# Patient Record
Sex: Male | Born: 1960 | Race: White | Hispanic: No | Marital: Married | Smoking: Former smoker
Health system: Southern US, Community
[De-identification: ages and names within clinical notes are randomized; demographics above are authoritative.]

## PROBLEM LIST (undated history)

## (undated) DIAGNOSIS — H269 Unspecified cataract: Secondary | ICD-10-CM

## (undated) DIAGNOSIS — F32A Depression, unspecified: Secondary | ICD-10-CM

## (undated) DIAGNOSIS — R112 Nausea with vomiting, unspecified: Secondary | ICD-10-CM

## (undated) DIAGNOSIS — G4730 Sleep apnea, unspecified: Secondary | ICD-10-CM

## (undated) DIAGNOSIS — Z9889 Other specified postprocedural states: Secondary | ICD-10-CM

## (undated) DIAGNOSIS — J45909 Unspecified asthma, uncomplicated: Secondary | ICD-10-CM

## (undated) DIAGNOSIS — E785 Hyperlipidemia, unspecified: Secondary | ICD-10-CM

## (undated) HISTORY — DX: Unspecified cataract: H26.9

## (undated) HISTORY — DX: Sleep apnea, unspecified: G47.30

## (undated) HISTORY — DX: Hyperlipidemia, unspecified: E78.5

## (undated) HISTORY — DX: Depression, unspecified: F32.A

## (undated) HISTORY — PX: SINUS SURGERY WITH INSTATRAK: SHX5215

## (undated) HISTORY — DX: Unspecified asthma, uncomplicated: J45.909

---

## 2009-02-07 ENCOUNTER — Ambulatory Visit (HOSPITAL_COMMUNITY): Admission: RE | Admit: 2009-02-07 | Discharge: 2009-02-07 | Payer: Self-pay | Admitting: Orthopedic Surgery

## 2011-12-10 ENCOUNTER — Encounter (INDEPENDENT_AMBULATORY_CARE_PROVIDER_SITE_OTHER): Payer: Self-pay | Admitting: *Deleted

## 2011-12-20 ENCOUNTER — Encounter (INDEPENDENT_AMBULATORY_CARE_PROVIDER_SITE_OTHER): Payer: Self-pay | Admitting: *Deleted

## 2011-12-20 ENCOUNTER — Telehealth (INDEPENDENT_AMBULATORY_CARE_PROVIDER_SITE_OTHER): Payer: Self-pay | Admitting: *Deleted

## 2011-12-20 ENCOUNTER — Other Ambulatory Visit (INDEPENDENT_AMBULATORY_CARE_PROVIDER_SITE_OTHER): Payer: Self-pay | Admitting: *Deleted

## 2011-12-20 DIAGNOSIS — Z1211 Encounter for screening for malignant neoplasm of colon: Secondary | ICD-10-CM

## 2011-12-20 NOTE — Telephone Encounter (Signed)
agree

## 2011-12-20 NOTE — Telephone Encounter (Signed)
PCP/Requesting MD: Helene Kelp   Name & DOB: Randon Harbeck [birth year]   Procedure: tcs  Reason/Indication:  screening  Has patient had this procedure before?  no  If so, when, by whom and where?    Is there a family history of colon cancer?  no  Who?  What age when diagnosed?  no  Is patient diabetic?   no      Does patient have prosthetic heart valve?  no  Do you have a pacemaker?  no  Has patient had joint replacement within last 12 months?  no  Is patient on Coumadin, Plavix and/or Aspirin? no  Medications: tylenol prn  Allergies: asa  Medication Adjustment:   Procedure date & time: 01/09/12 at 11:15

## 2011-12-20 NOTE — Telephone Encounter (Signed)
Patient needs movi prep 

## 2011-12-24 MED ORDER — PEG-KCL-NACL-NASULF-NA ASC-C 100 G PO SOLR: 1.0000 | Freq: Once | ORAL | Status: DC

## 2012-01-02 ENCOUNTER — Encounter (HOSPITAL_COMMUNITY): Payer: Self-pay | Admitting: Pharmacy Technician

## 2012-01-09 ENCOUNTER — Ambulatory Visit (HOSPITAL_COMMUNITY)
Admission: RE | Admit: 2012-01-09 | Discharge: 2012-01-09 | Disposition: A | Discharge status: Home / Self Care | Payer: BC Managed Care – PPO | Source: Ambulatory Visit | Attending: Internal Medicine | Admitting: Internal Medicine

## 2012-01-09 ENCOUNTER — Encounter (HOSPITAL_COMMUNITY)
Admission: RE | Disposition: A | Discharge status: Home / Self Care | Payer: Self-pay | Source: Ambulatory Visit | Attending: Internal Medicine

## 2012-01-09 ENCOUNTER — Encounter (HOSPITAL_COMMUNITY): Payer: Self-pay | Admitting: *Deleted

## 2012-01-09 DIAGNOSIS — K644 Residual hemorrhoidal skin tags: Secondary | ICD-10-CM

## 2012-01-09 DIAGNOSIS — Z1211 Encounter for screening for malignant neoplasm of colon: Secondary | ICD-10-CM

## 2012-01-09 DIAGNOSIS — K6389 Other specified diseases of intestine: Secondary | ICD-10-CM

## 2012-01-09 DIAGNOSIS — K5730 Diverticulosis of large intestine without perforation or abscess without bleeding: Secondary | ICD-10-CM | POA: Insufficient documentation

## 2012-01-09 HISTORY — DX: Nausea with vomiting, unspecified: R11.2

## 2012-01-09 HISTORY — PX: COLONOSCOPY: SHX5424

## 2012-01-09 HISTORY — DX: Other specified postprocedural states: Z98.890

## 2012-01-09 SURGERY — COLONOSCOPY
Anesthesia: Moderate Sedation

## 2012-01-09 MED ORDER — MEPERIDINE HCL 50 MG/ML IJ SOLN
INTRAMUSCULAR | Status: AC
  Filled 2012-01-09: qty 1

## 2012-01-09 MED ORDER — MIDAZOLAM HCL 5 MG/5ML IJ SOLN
INTRAMUSCULAR | Status: AC
  Filled 2012-01-09: qty 10

## 2012-01-09 MED ORDER — STERILE WATER FOR IRRIGATION IR SOLN
Status: DC | PRN
  Administered 2012-01-09: 11:00:00

## 2012-01-09 MED ORDER — MIDAZOLAM HCL 5 MG/5ML IJ SOLN
INTRAMUSCULAR | Status: DC | PRN
  Administered 2012-01-09: 2 mg via INTRAVENOUS
  Administered 2012-01-09: 1 mg via INTRAVENOUS
  Administered 2012-01-09: 2 mg via INTRAVENOUS

## 2012-01-09 MED ORDER — SODIUM CHLORIDE 0.45 % IV SOLN
INTRAVENOUS | Status: DC
  Administered 2012-01-09: 1000 mL via INTRAVENOUS

## 2012-01-09 MED ORDER — MEPERIDINE HCL 50 MG/ML IJ SOLN
INTRAMUSCULAR | Status: DC | PRN
  Administered 2012-01-09 (×2): 25 mg via INTRAVENOUS

## 2012-01-09 NOTE — Op Note (Signed)
COLONOSCOPY PROCEDURE REPORT  PATIENT:  Guy Sanchez  MR#:  [id number] Birthdate:  05-23-1960, 51 y.o., male Endoscopist:  Dr. Malissa Hippo, MD Referred By:    Doy Hutching. Caryn Bee, [place] Procedure Date: 01/09/2012  Procedure:   Colonoscopy  Indications:  Patient is 51 year old Caucasian male was undergoing average risk screening colonoscopy.  Informed Consent:  The procedure and risks were reviewed with the patient and informed consent was obtained.  Medications:  Demerol 50 mg IV Versed 5 mg IV  Description of procedure:  After a digital rectal exam was performed, that colonoscope was advanced from the anus through the rectum and colon to the area of the cecum, ileocecal valve and appendiceal orifice. The cecum was deeply intubated. These structures were well-seen and photographed for the record. From the level of the cecum and ileocecal valve, the scope was slowly and cautiously withdrawn. The mucosal surfaces were carefully surveyed utilizing scope tip to flexion to facilitate fold flattening as needed. The scope was pulled down into the rectum where a thorough exam including retroflexion was performed.  Findings:   Prep excellent. Patchy mucosal pigmentation involving sigmoid and descending colon. Single diverticulum at sigmoid colon. Normal rectal mucosa. Small hemorrhoids below the dentate line.  Therapeutic/Diagnostic Maneuvers Performed:  noe  Complications:  none  Cecal Withdrawal Time:  9 minutes  Impression:  Examination performed to cecum. 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 Single elliptical and at sigmoid colon. Small external hemorrhoids. No evidence of colonic polyps.  Recommendations:  Standard instructions given. Next screening exam in 10 years.  Guy Sanchez U  01/09/2012 11:43 AM  CC: Dr. Horald Pollen., PA & Dr. Bonnetta Barry ref. provider found

## 2012-01-09 NOTE — H&P (Signed)
Guy Sanchez is an 51 y.o. male.   Chief Complaint: Patient is here for colonoscopy. HPI: Patient is 51 year old Caucasian male who is undergoing  screening colonoscopy. This is patient's first exam. He denies abdominal pain rectal bleeding or change in his bowel habits. Family history is negative for colorectal carcinoma.  Past Medical History  Diagnosis Date  . PONV (postoperative nausea and vomiting)     Past Surgical History  Procedure Date  . Sinus surgery with instatrak     History reviewed. No pertinent family history. Social History:  reports that he has never smoked. He does not have any smokeless tobacco history on file. He reports that he drinks alcohol. He reports that he does not use illicit drugs.  Allergies:  Allergies  Allergen Reactions  . Aspirin Other (See Comments)    Stomach problems from childhood.    Medications Prior to Admission  Medication Sig Dispense Refill  . Multiple Vitamins-Minerals (MULTIVITAMIN PO) Take 1 tablet by mouth daily.      . niacin 100 MG tablet Take 100 mg by mouth daily with breakfast.      . peg 3350 powder (MOVIPREP) 100 G SOLR Take 1 kit (100 g total) by mouth once.  1 kit  0  . Glucosamine 500 MG TABS Take 1 tablet by mouth daily.        No results found for this or any previous visit (from the past 48 hour(s)). No results found.  ROS  Blood pressure 126/74, pulse 51, temperature 97.7 F (36.5 C), temperature source Oral, resp. rate 15, height 6\' 3"  (1.905 m), weight 184 lb (83.462 kg), SpO2 96.00%. Physical Exam  Constitutional: He appears well-developed and well-nourished.  HENT:  Mouth/Throat: Oropharynx is clear and moist.  Eyes: Conjunctivae normal are normal. No scleral icterus.  Neck: No thyromegaly present.  Cardiovascular: Normal rate, regular rhythm and normal heart sounds.   No murmur heard. Respiratory: Effort normal and breath sounds normal.  GI: Soft. He exhibits no distension and no mass. There is no  tenderness.  Musculoskeletal: He exhibits no edema.  Lymphadenopathy:    He has no cervical adenopathy.  Neurological: He is alert.  Skin: Skin is warm and dry.     Assessment/Plan Average risk screening colonoscopy.  Sundus Pete U 01/09/2012, 11:11 AM

## 2012-01-14 ENCOUNTER — Encounter (HOSPITAL_COMMUNITY): Payer: Self-pay | Admitting: Internal Medicine

## 2014-01-15 ENCOUNTER — Telehealth: Payer: Self-pay | Admitting: Physician Assistant

## 2014-01-15 ENCOUNTER — Encounter (INDEPENDENT_AMBULATORY_CARE_PROVIDER_SITE_OTHER): Payer: Self-pay

## 2014-01-15 ENCOUNTER — Ambulatory Visit (INDEPENDENT_AMBULATORY_CARE_PROVIDER_SITE_OTHER): Payer: Federal, State, Local not specified - PPO | Admitting: Family Medicine

## 2014-01-15 ENCOUNTER — Encounter: Payer: Self-pay | Admitting: Family Medicine

## 2014-01-15 VITALS — BP 127/70 | HR 72 | Temp 98.0°F | Ht 75.0 in | Wt 187.4 lb

## 2014-01-15 DIAGNOSIS — J069 Acute upper respiratory infection, unspecified: Secondary | ICD-10-CM

## 2014-01-15 MED ORDER — AZITHROMYCIN 250 MG PO TABS: ORAL_TABLET | ORAL | Status: DC

## 2014-01-15 NOTE — Progress Notes (Signed)
   Subjective:    Patient ID: Guy Sanchez, male    DOB: 30-Sep-1960, 53 y.o.   MRN: [id number]  HPI Patient is here with c/o sinus congestion and uri sx's.  Review of Systems    No chest pain, SOB, HA, dizziness, vision change, N/V, diarrhea, constipation, dysuria, urinary urgency or frequency, myalgias, arthralgias or rash.  Objective:    BP 127/70 mmHg  Pulse 72  Temp(Src) 98 F (36.7 C) (Oral)  Ht 6\' 3"  (1.905 m)  Wt 187 lb 6.4 oz (85.004 kg)  BMI 23.42 kg/m2 Physical Exam Vital signs noted  Well developed well nourished male.  HEENT - Head atraumatic Normocephalic                Eyes - PERRLA, Conjuctiva - clear Sclera- Clear EOMI                Ears - EAC's Wnl TM's Wnl Gross Hearing WNL                Nose - Nares patent                 Throat - oropharanx wnl Respiratory - Lungs CTA bilateral Cardiac - RRR S1 and S2 without murmur GI - Abdomen soft Nontender and bowel sounds active x 4 Extremities - No edema. Neuro - Grossly intact.       Assessment & Plan:     ICD-9-CM ICD-10-CM   1. URI (upper respiratory infection) 465.9 J06.9 azithromycin (ZITHROMAX) 250 MG tablet   Push po fluids, rest, tylenol and motrin otc prn as directed for fever, arthralgias, and myalgias.  Follow up prn if sx's continue or persist.  No Follow-up on file.  Deatra CanterWilliam J Oxford FNP

## 2014-01-15 NOTE — Telephone Encounter (Signed)
Appt given for today 

## 2016-01-30 DIAGNOSIS — J342 Deviated nasal septum: Secondary | ICD-10-CM | POA: Insufficient documentation

## 2016-01-30 DIAGNOSIS — J329 Chronic sinusitis, unspecified: Secondary | ICD-10-CM | POA: Insufficient documentation

## 2016-01-30 DIAGNOSIS — J310 Chronic rhinitis: Secondary | ICD-10-CM | POA: Insufficient documentation

## 2016-01-30 DIAGNOSIS — J339 Nasal polyp, unspecified: Secondary | ICD-10-CM

## 2016-02-10 ENCOUNTER — Encounter: Payer: Self-pay | Admitting: Family Medicine

## 2016-02-10 ENCOUNTER — Ambulatory Visit (INDEPENDENT_AMBULATORY_CARE_PROVIDER_SITE_OTHER): Payer: BC Managed Care – PPO | Admitting: Family Medicine

## 2016-02-10 VITALS — BP 116/69 | Ht 75.5 in | Wt 191.0 lb

## 2016-02-10 DIAGNOSIS — M7501 Adhesive capsulitis of right shoulder: Secondary | ICD-10-CM | POA: Diagnosis not present

## 2016-02-10 DIAGNOSIS — M67922 Unspecified disorder of synovium and tendon, left upper arm: Secondary | ICD-10-CM | POA: Diagnosis not present

## 2016-02-10 NOTE — Patient Instructions (Signed)
Shoulder routine--4-5 days a weel Dumbbells--choose a qweight you can do 8-12 repetitions. Goal is three sets per day of each exercise. Shoulder overhead press, lateral and forward raise, chest press, bicep and triceps.  Great to see you!

## 2016-02-11 DIAGNOSIS — M779 Enthesopathy, unspecified: Secondary | ICD-10-CM | POA: Insufficient documentation

## 2016-02-11 DIAGNOSIS — M7501 Adhesive capsulitis of right shoulder: Secondary | ICD-10-CM | POA: Insufficient documentation

## 2016-02-11 DIAGNOSIS — M67922 Unspecified disorder of synovium and tendon, left upper arm: Secondary | ICD-10-CM | POA: Insufficient documentation

## 2016-02-11 NOTE — Assessment & Plan Note (Signed)
Right shoulder pain that is most likely related to some primary mild adhesive capsulitis particularly in the posterior capsule.  I showed him stretching motion for the posterior capsule and encouraged him to do that 10 or more times daily.  Also gave him encouragement to return to his previous workout protocol we have discussed exercises and weights in detail.  He notes he was asymptomatic when he was going to gym much more readily.  Even if he can just do the basic upper body shoulder routine for 5 days a week I think he would improve.  I would expect this to improve over the next 4-6 weeks, if he has any new or worsening symptoms or does not resolve he will follow-up when necessary.

## 2016-02-11 NOTE — Progress Notes (Signed)
Guy SaranMichael Sanchez - [age] y.o. male MRN [id number]  Date of birth: 1960-03-30    SUBJECTIVE:      Chief Complaint: HPI:  1. Left elbow pain: Has been gradually worsening for the last 8-12 months.  No specific injury.  He does note that it seems to started around the time he bought a new base guitar.  Says initially he was so excited to have the base guitar that he was playing and sometimes 2+ hours a day.  He notes that the neck of the guitar is greater in circumference, the cords are harder to.  Recently he's only been playing it intermittently.  The pain in his elbow is with certain movements and if he tries to carry anything in his left hand.  Pain 2-3 out of 10 at its worst.  Can be sharp and shooting.  Occasionally aching.  He's had no weakness or numbness in his left hand.  He is right-hand dominant.  #2.  Right shoulder pain: This is also been going on for better part of a year.  Pain is with certain movements and he feels his range of motion is limited because of this.  Most notable when he reaches backward or upward.  He's noted no weakness in the arm.   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  He's noted no atrophy of the right arm, no weakness, no numbness or tingling, no problem with fine motor skills.  In his job he also has to frequently carry heavy equipment, usually carries it on the right side.  Sounds like it is usually in a briefcase or similar container.    ROS:     See HPI above.  Additional pertinent review of systems is negative for other unusual arthralgias or myalgias, no unusual weight change.  Denies fever, sweats, chills.  PERTINENT  PMH / PSH FH / / SH:  Past Medical, Surgical, Social, and Family History Reviewedfrom the notes kindly provided by his PCP:   Pertinent findings include:  Prior history of some right rotator cuff issues for which she received a steroid injection many years ago.  No  personal history of diabetes mellitus. No prior hospitalizations or major  Surgeries on joints, has had sinus surgery..  Former smoker.  No first-degree relative with history of unusual rheumatological problems.  OBJECTIVE: BP 116/69   Ht 6' 3.5" (1.918 m)   Wt 191 lb (86.6 kg)   BMI 23.56 kg/m   Physical Exam:  Vital signs are reviewed. GENERAL: Tall, well-developed male no acute distress SHOULDERS: Left shoulder full range of motion and normal strength.  Right shoulder has intact strength in all planes of the rotator cuff, intact strength bicep and tricep.  Some mild pain and limited range of motion in backward extension  This is where his issues seem to occur most.  He also has some mild pain with direct overhead press. SCAPULAR: Symmetrical scapular motion without dyskinesia.  Normal upper back musculature. BACK: Vertebral column appears to be symmetrical, intact, there is no pain with percussion.  There is no tenderness to palpation of the back musculature. ELBOW: Bilaterally he has full range of motion in flexion and extension.   On the right he has intact range of motion strength with supination/pronation.  On the left he has mild pain with supination and pronation  And the pain appears to be in the flexor and extensor muscle mass and it  is very mildly tender to palpation when the muscle is in active distress. RISKS: Bilaterally for range of motion flexion extension, painless activity. NEURO: Nerve roots C4-5-6 are intact and bilaterally symmetrical.  ASSESSMENT & PLAN: For assessment  and plan please see problem oriented charting.

## 2016-02-11 NOTE — Assessment & Plan Note (Signed)
Subacute or chronic left elbow pain.  He does seem to have originated when he began playing the base guitar.  I suspect the unusual position in the increased difficulty with beats tension on the cords has placed him in some chronic overuse.   I gave him handout on forearm exercises for extensor and flexor tendons and we discussed at length.

## 2016-11-24 ENCOUNTER — Ambulatory Visit (HOSPITAL_COMMUNITY)
Admission: EM | Admit: 2016-11-24 | Discharge: 2016-11-24 | Disposition: A | Discharge status: Home / Self Care | Payer: BC Managed Care – PPO | Attending: Family Medicine | Admitting: Family Medicine

## 2016-11-24 ENCOUNTER — Encounter (HOSPITAL_COMMUNITY): Payer: Self-pay | Admitting: Family Medicine

## 2016-11-24 DIAGNOSIS — T7840XA Allergy, unspecified, initial encounter: Secondary | ICD-10-CM

## 2016-11-24 DIAGNOSIS — Z91018 Allergy to other foods: Secondary | ICD-10-CM

## 2016-11-24 DIAGNOSIS — L509 Urticaria, unspecified: Secondary | ICD-10-CM

## 2016-11-24 DIAGNOSIS — L500 Allergic urticaria: Secondary | ICD-10-CM

## 2016-11-24 MED ORDER — DIPHENHYDRAMINE HCL 50 MG/ML IJ SOLN
50.0000 mg | Freq: Once | INTRAMUSCULAR | Status: AC
  Administered 2016-11-24: 50 mg via INTRAVENOUS

## 2016-11-24 MED ORDER — METHYLPREDNISOLONE SODIUM SUCC 125 MG IJ SOLR
125.0000 mg | Freq: Once | INTRAMUSCULAR | Status: AC
  Administered 2016-11-24: 125 mg via INTRAMUSCULAR

## 2016-11-24 MED ORDER — PREDNISONE 10 MG (21) PO TBPK: ORAL_TABLET | Freq: Every day | ORAL | 0 refills | Status: DC

## 2016-11-24 MED ORDER — METHYLPREDNISOLONE SODIUM SUCC 125 MG IJ SOLR
INTRAMUSCULAR | Status: AC
  Filled 2016-11-24: qty 2

## 2016-11-24 MED ORDER — DIPHENHYDRAMINE HCL 50 MG/ML IJ SOLN
INTRAMUSCULAR | Status: AC
  Filled 2016-11-24: qty 1

## 2016-11-24 NOTE — ED Triage Notes (Signed)
Pt here for allergic reaction to possible tuna. Hives all over body. Slight tightening in throat.

## 2016-11-26 NOTE — ED Provider Notes (Signed)
  [facility]   [id number] 11/24/16 Arrival Time: 1954  ASSESSMENT & PLAN:  1. Allergic reaction, initial encounter   2. Hives     Meds ordered this encounter  Medications  . methylPREDNISolone sodium succinate (SOLU-MEDROL) 125 mg/2 mL injection 125 mg  . diphenhydrAMINE (BENADRYL) injection 50 mg  . predniSONE (STERAPRED UNI-PAK 21 TAB) 10 MG (21) TBPK tablet    Sig: Take by mouth daily. Take as directed.    Dispense:  21 tablet    Refill:  0   Emergency Department if any worsening. He and his wife agree. Reviewed expectations re: course of current medical issues. Questions answered. Outlined signs and symptoms indicating need for more acute intervention. Patient verbalized understanding. After Visit Summary given.   SUBJECTIVE:  Guy Sanchez is a 56 y.o. male who presents with complaint of possible allergic reaction. Hives all over approx 20 minutes after eating tuna. Afebrile. No SOB. "A little tight in my throat." No n/v. No specific aggravating or alleviating factors reported. Ambulatory without difficulty. No OTC treatment. No h/o similar. No abdominal or chest pain.  ROS: As per HPI. All other systems negative.   OBJECTIVE:  Vitals:   11/24/16 2004  BP: (!) 105/59  Pulse: 85  Resp: 18  SpO2: 97%    General appearance: alert; no distress; speaking in full sentences Eyes: PERRLA; EOMI; conjunctiva normal HENT: normocephalic; atraumatic; oral exam normal Lungs: clear to auscultation bilaterally Heart: regular rate and rhythm Abdomen: soft, non-tender Skin: diffuse hives all over body Psychological: alert and cooperative; normal mood and affect  Allergies  Allergen Reactions  . Aspirin Other (See Comments)    Stomach problems from childhood.    Past Medical History:  Diagnosis Date  . PONV (postoperative nausea and vomiting)    Social History   Social History  . Marital status: Married    Spouse name: N/A  . Number of children: N/A  .  Years of education: N/A   Occupational History  . Not on file.   Social History Main Topics  . Smoking status: Never Smoker  . Smokeless tobacco: Not on file  . Alcohol use 0.0 oz/week     Comment: social drinker  . Drug use: No  . Sexual activity: Not on file   Other Topics Concern  . Not on file   Social History Narrative  . No narrative on file   No FH of seafood allergy.  Past Surgical History:  Procedure Laterality Date  . COLONOSCOPY  01/09/2012   Procedure: COLONOSCOPY;  Surgeon: Malissa Hippo, MD;  Location: [facility];  Service: Endoscopy;  Laterality: N/A;  11:15  . SINUS SURGERY WITH Danie Binder, MD 11/26/16 1005

## 2018-08-11 DIAGNOSIS — J343 Hypertrophy of nasal turbinates: Secondary | ICD-10-CM | POA: Insufficient documentation

## 2019-04-14 ENCOUNTER — Ambulatory Visit: Payer: Self-pay

## 2019-04-14 ENCOUNTER — Ambulatory Visit: Payer: BC Managed Care – PPO | Admitting: Family Medicine

## 2019-04-14 ENCOUNTER — Other Ambulatory Visit: Payer: Self-pay

## 2019-04-14 ENCOUNTER — Encounter: Payer: Self-pay | Admitting: Family Medicine

## 2019-04-14 VITALS — BP 104/70 | HR 89 | Ht 75.5 in | Wt 200.4 lb

## 2019-04-14 DIAGNOSIS — M25521 Pain in right elbow: Secondary | ICD-10-CM

## 2019-04-14 DIAGNOSIS — M25512 Pain in left shoulder: Secondary | ICD-10-CM

## 2019-04-14 NOTE — Progress Notes (Signed)
Subjective:    I'm seeing this patient as a consultation for:  Dr. Renne Crigler. Note will be routed back to referring provider/PCP.  CC: L shoulder and R elbow and forearm pain  I, Molly Weber, LAT, ATC, am serving as scribe for Dr. Clementeen Graham.  HPI: Pt is a 59 y/o male presenting w/ c/o L shoulder and R arm pain.  L shoulder:  Pain began x 5 months ago w/ no known MOI.  He rates his pain at a 4-6/10 w/ aggravating activities and describes his pain as aching.  Radiating pain: No L shoulder mechanical symptoms: No L UE weakness: No Aggravating factors: Donning/doffing shirt; getting arm into position for a back squat Treatments tried: HEP that he found online including pec stretching and shoulder extension  R arm: R arm pain began Fall 2020 due mainly to repetitive computer use.  He rates his pain at a 4-5/10 and describes his pain as aching.  Radiating pain: Yes into the R posterior forearm R UE weakness: Yes w/ R grip strength Aggravating factors: Computer use; yard work Treatments tried: wrist brace; rest  Past medical history, Surgical history, Family history, Social history, Allergies, and medications have been entered into the medical record, reviewed.   Review of Systems: No new headache, visual changes, nausea, vomiting, diarrhea, constipation, dizziness, abdominal pain, skin rash, fevers, chills, night sweats, weight loss, swollen lymph nodes, body aches, joint swelling, muscle aches, chest pain, shortness of breath, mood changes, visual or auditory hallucinations.   Objective:    Vitals:   04/14/19 0907  BP: 104/70  Pulse: 89  SpO2: 96%   General: Well Developed, well nourished, and in no acute distress.  Neuro/Psych: Alert and oriented x3, extra-ocular muscles intact, able to move all 4 extremities, sensation grossly intact. Skin: Warm and dry, no rashes noted.  Respiratory: Not using accessory muscles, speaking in full sentences, trachea midline.  Cardiovascular:  Pulses palpable, no extremity edema. Abdomen: Does not appear distended. MSK:  C-spine: Normal motion normal-appearing nontender.  Left shoulder: Normal-appearing Nontender Full abduction, and internal range of motion.  External rotation limited about 30 degrees beyond neutral position. Intact strength to abduction external and internal rotation. Mildly positive Hawkins and Neer's test. Negative Yergason's and speeds test.  Contralateral right shoulder: Normal.  Appearing Nontender Normal motion. Normal strength. Negative impingement and biceps tendinitis testing.  Right elbow: Normal-appearing. Normal motion. Tender palpation lateral epicondyle. Pain with resisted wrist and finger extension.  Left elbow normal-appearing normal motion nontender normal strength.  Lab and Radiology Results  Limited musculoskeletal ultrasound left shoulder: Biceps tendon normal-appearing intact in bicipital groove. Subscapularis tendon with calcific change and insertion and irregular footplate.  No visible tear. Supraspinatus tendon normal-appearing with no significant subacromial bursitis. Infraspinatus tendon slightly thinned no definitive tears otherwise normal-appearing AC joint mildly narrowed mild effusion. 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  Right lateral epicondyle elbow: Relatively normal-appearing with no significant tear disruption.  No elbow effusion. Impression: Lateral epicondylitis  Impression and Recommendations:    Assessment and Plan: 59 y.o. male with left shoulder pain and left elbow pain.  Left shoulder pain: Evidence of rotator cuff tendinopathy on physical exam and on ultrasound examination today.  Discussed options.  Plan for trial of home exercise program and physical therapy.  Recommend also Voltaren gel.  Recheck back in about a month.  If not better would proceed with injection.  Right elbow pain: Lateral epicondylitis:  Again plan for home exercise program and  some physical therapy.  Voltaren gel should be helpful as well.  Check back in 1 month..  CC: Deland Pretty, MD  PDMP not reviewed this encounter. Orders Placed This Encounter  Procedures  . [place] LIMITED JOINT SPACE STRUCTURES UP BILAT(NO LINKED CHARGES)    Order Specific Question:   Reason for Exam (SYMPTOM  OR DIAGNOSIS REQUIRED)    Answer:   R elbow pain    Order Specific Question:   Preferred imaging location?    Answer:   [place]  . [place] LIMITED JOINT SPACE STRUCTURES UP LEFT(NO LINKED CHARGES)    Order Specific Question:   Reason for Exam (SYMPTOM  OR DIAGNOSIS REQUIRED)    Answer:   left shoulder pain    Order Specific Question:   Preferred imaging location?    Answer:   [place]    Order Specific Question:   Release to patient    Answer:   Immediate  . Ambulatory referral to Physical Therapy    Referral Priority:   Routine    Referral Type:   Physical Medicine    Referral Reason:   Specialty Services Required    Requested Specialty:   Physical Therapy   No orders of the defined types were placed in this encounter.   Discussed warning signs or symptoms. Please see discharge instructions. Patient expresses understanding.   The above documentation has been reviewed and is accurate and complete Lynne Leader

## 2019-04-14 NOTE — Patient Instructions (Addendum)
Thank you for coming in today. Attend PT.  Use voltaren gel up to 4x daily for pain.  Work on the exercises we reviewed.  Recheck in about 1 month.  Return sooner if needed.  Please perform the exercise program that we have prepared for you and gone over in detail on a daily basis.  In addition to the handout you were provided you can access your program through: [email or web address]   Your unique program code is: [email or web address] and [id number]

## 2019-04-21 ENCOUNTER — Encounter: Payer: Self-pay | Admitting: Family Medicine

## 2019-04-21 ENCOUNTER — Ambulatory Visit: Payer: BC Managed Care – PPO | Attending: Family Medicine

## 2019-05-07 ENCOUNTER — Other Ambulatory Visit: Payer: Self-pay

## 2019-05-07 ENCOUNTER — Ambulatory Visit: Payer: BC Managed Care – PPO | Attending: Family Medicine

## 2019-05-07 DIAGNOSIS — M25612 Stiffness of left shoulder, not elsewhere classified: Secondary | ICD-10-CM | POA: Insufficient documentation

## 2019-05-07 DIAGNOSIS — M25521 Pain in right elbow: Secondary | ICD-10-CM | POA: Insufficient documentation

## 2019-05-07 DIAGNOSIS — M25512 Pain in left shoulder: Secondary | ICD-10-CM | POA: Insufficient documentation

## 2019-05-07 DIAGNOSIS — G8929 Other chronic pain: Secondary | ICD-10-CM | POA: Diagnosis present

## 2019-05-07 NOTE — Patient Instructions (Signed)
Tape management to remove if irritated to skin , leave on for 3-4 days  If helpful.  LT shoulder ER at wall , wall slide in corner. Behind back with strap, 2-3 reps 2-3x/day 10-30 sec

## 2019-05-07 NOTE — Therapy (Signed)
[place], [place], [address] Phone: [phone]   Fax:  [phone]  Physical Therapy Evaluation  Patient Details  Name: Guy Sanchez MRN: [id number] Date of Birth: 03-07-1960 Referring Provider (PT): Lynne Leader, Md   Encounter Date: 05/07/2019  PT End of Session - 05/07/19 0739    Visit Number  1    Number of Visits  12    Date for PT Re-Evaluation  06/19/19    Authorization Type  BCBS    PT Start Time  [phone]    PT Stop Time  0735    PT Time Calculation (min)  42 min    Activity Tolerance  Patient tolerated treatment well    Behavior During Therapy  [facility] for tasks assessed/performed       Past Medical History:  Diagnosis Date  . PONV (postoperative nausea and vomiting)     Past Surgical History:  Procedure Laterality Date  . COLONOSCOPY  01/09/2012   Procedure: COLONOSCOPY;  Surgeon: Rogene Houston, MD;  [facility];  Service: Endoscopy;  Laterality: N/A;  11:15  . SINUS SURGERY WITH INSTATRAK      There were no vitals filed for this visit.   Subjective Assessment - 05/07/19 0657    Subjective  Started in fall 2020 working on computer. Was doing some tree trimming also. ROM decr LT shoulder. Was squatting with weight  last spring for exercises.  RT elbow and forearm. grip strength decreased.  Exercises have helped. Wrist was painful also.   Was wearing a brace for 3 weeks.    Limitations  Lifting   gripping.     Computer down so full pain assessment not done   [facility] PT Assessment - 05/07/19 0001      Assessment   Medical Diagnosis  right elbow , LT shoulder pain    Referring Provider (PT)  Lynne Leader, Md    Onset Date/Surgical Date  --   fall 2020   Prior Therapy  No      Precautions   Precautions  None      Restrictions   Weight Bearing Restrictions  No      Balance Screen   Has the patient fallen in the past 6 months  No      Prior Function   Level of  Independence  Independent      Cognition   Overall Cognitive Status  Within Functional Limits for tasks assessed      Posture/Postural Control   Posture/Postural Control  No significant limitations      ROM / Strength   AROM / PROM / Strength  AROM;Strength      AROM   Overall AROM Comments  elbow ROM equal to LT     AROM Assessment Site  Shoulder;Elbow    Right/Left Shoulder  Left;Right    Right Shoulder Flexion  145 Degrees    Right Shoulder ABduction  130 Degrees    Right Shoulder Internal Rotation  55 Degrees    Right Shoulder External Rotation  125 Degrees    Left Shoulder Flexion  125 Degrees    Left Shoulder ABduction  130 Degrees    Left Shoulder Internal Rotation  38 Degrees    Left Shoulder External Rotation  80 Degrees    Left Shoulder Horizontal ABduction  5 Degrees    Left Shoulder Horizontal ADduction  120 Degrees    Right/Left Elbow  Right      Strength  Overall Strength Comments  WNL UE ,    Grip 113 RT and LT       Palpation   Palpation comment  tender along exensor mechanism of RT forarm                Objective measurements completed on examination: See above findings.              PT Education - 05/07/19 0731    Education Details  POC HEP    Person(s) Educated  Patient    Methods  Explanation;Demonstration;Tactile cues;Verbal cues;Handout    Comprehension  Verbalized understanding;Returned demonstration       PT Short Term Goals - 05/07/19 0743      PT SHORT TERM GOAL #1   Title  He will be indpendent with initial hEP    Time  2    Period  Weeks    Status  New      PT SHORT TERM GOAL #2   Title  LT shoulder ER increased to 95 degrees active    Time  2    Period  Weeks    Status  New      PT SHORT TERM GOAL #3   Title  activ eLT shoulder flexion incr to  90 degrees    Time  2    Period  Weeks    Status  New      PT SHORT TERM GOAL #4   Title  RT forarm pain decreased 20% or mroe    Time  2    Period  Weeks     Status  New        PT Long Term Goals - 05/07/19 0745      PT LONG TERM GOAL #1   Title  He will be indpenendt all HEP issued    Time  6    Period  Weeks    Status  New      PT LONG TERM GOAL #2   Title  He will report free painfree ROM of Lt shoulder    Time  6    Period  Weeks    Status  New      PT LONG TERM GOAL #3   Title  He will report pain decreased 75% or more with computer use and lifting    Time  6    Period  Weeks    Status  New      PT LONG TERM GOAL #4   Title  elbow FOTO decrease ot 25% limited    Time  6    Period  Weeks    Status  New             Plan - 05/07/19 0739    Clinical Impression Statement  Guy Sanchez  presents with RT elbow/forearm pain and LT shoulder pain and stiffness .  He is independetn but has some issues with computer uses and lifting and gripping with RT ahand. and reaching with  Lt arm.      He should improve with skilled PT and consistent HEP    Personal Factors and Comorbidities  Time since onset of injury/illness/exacerbation    Examination-Activity Limitations  Reach Overhead;Lift    Stability/Clinical Decision Making  Stable/Uncomplicated    Clinical Decision Making  Low    Rehab Potential  Good    PT Frequency  2x / week    PT Duration  6 weeks    PT Treatment/Interventions  Taping;Dry  needling;Passive range of motion;Manual techniques;Therapeutic exercise;Therapeutic activities;Patient/family education;Iontophoresis 4mg /ml Dexamethasone;Moist Heat;Ultrasound    PT Next Visit Plan  STW to RT forearm , Mobds anmd ROM to LT shoulder, modalites as needed . assess tape.    PT Home Exercise Plan  strap behind back stretch , corner overhead stretch  doorway ER stretch , wrist flexion steretch    Consulted and Agree with Plan of Care  Patient       Patient will benefit from skilled therapeutic intervention in order to improve the following deficits and impairments:  Decreased range of motion, Pain, Decreased activity tolerance,  Increased muscle spasms, Impaired UE functional use  Visit Diagnosis: Pain in right elbow  Chronic left shoulder pain  Stiffness of left shoulder, not elsewhere classified     Problem List Patient Active Problem List   Diagnosis Date Noted  . Adhesive capsulitis of right shoulder 02/11/2016  . Tendinopathy of left elbow 02/11/2016  . Chronic rhinitis 01/30/2016  . Deviated septum 01/30/2016  . Sinusitis with nasal polyps 01/30/2016    02/01/2016  PT 05/07/2019, 7:48 AM  [facility] [address] [place], [place], [place] Phone: [phone]   Fax:  [phone]  Name: Guy Sanchez MRN: [name] Date of Birth: [birth year]

## 2019-05-12 ENCOUNTER — Ambulatory Visit: Payer: BC Managed Care – PPO

## 2019-05-12 ENCOUNTER — Other Ambulatory Visit: Payer: Self-pay

## 2019-05-12 DIAGNOSIS — M25521 Pain in right elbow: Secondary | ICD-10-CM | POA: Diagnosis not present

## 2019-05-12 DIAGNOSIS — M25612 Stiffness of left shoulder, not elsewhere classified: Secondary | ICD-10-CM

## 2019-05-12 DIAGNOSIS — M25512 Pain in left shoulder: Secondary | ICD-10-CM

## 2019-05-12 DIAGNOSIS — G8929 Other chronic pain: Secondary | ICD-10-CM

## 2019-05-12 NOTE — Patient Instructions (Signed)
Added 3 sets of wrist extension RT  Followed by stretch 30 sec at home 2-3 pouinds 1-2x/day

## 2019-05-12 NOTE — Therapy (Signed)
[place], [place], [address] Phone: [phone]   Fax:  [phone]  Physical Therapy Treatment  Patient Details  Name: Guy Sanchez MRN: [id number] Date of Birth: 1960-08-08 Referring Provider (PT): Lynne Leader, MD   Encounter Date: 05/12/2019  PT End of Session - 05/12/19 0709    Visit Number  2    Number of Visits  12    Date for PT Re-Evaluation  06/19/19    Authorization Type  BCBS    PT Start Time  0701    PT Stop Time  0745    PT Time Calculation (min)  44 min    Activity Tolerance  Patient tolerated treatment well    Behavior During Therapy  [facility] for tasks assessed/performed       Past Medical History:  Diagnosis Date  . PONV (postoperative nausea and vomiting)     Past Surgical History:  Procedure Laterality Date  . COLONOSCOPY  01/09/2012   Procedure: COLONOSCOPY;  Surgeon: Rogene Houston, MD;  [facility];  Service: Endoscopy;  Laterality: N/A;  11:15  . SINUS SURGERY WITH INSTATRAK      There were no vitals filed for this visit.  Subjective Assessment - 05/12/19 0714    Subjective  No new issues since last session. Tape of no benefit.                       [facility] Adult PT Treatment/Exercise - 05/12/19 0001      Exercises   Exercises  Wrist;Shoulder      Wrist Exercises   Wrist Extension  Right;10 reps    Bar Weights/Barbell (Wrist Extension)  2 lbs    Wrist Extension Limitations  3 sets followed by 30 sec wrist extensor stretch      Manual Therapy   Manual Therapy  Joint mobilization;Passive ROM    Joint Mobilization  LT shoulder inferior and posterior glides    Passive ROM  all directions 30 sec  3 reps to tolerance      UBE 6 min L2       PT Education - 05/12/19 0858    Education Details  HEP    Person(s) Educated  Patient    Methods  Explanation;Verbal cues   declined handout   Comprehension  Returned demonstration;Verbalized  understanding       PT Short Term Goals - 05/07/19 0743      PT SHORT TERM GOAL #1   Title  He will be indpendent with initial hEP    Time  2    Period  Weeks    Status  New      PT SHORT TERM GOAL #2   Title  LT shoulder ER increased to 95 degrees active    Time  2    Period  Weeks    Status  New      PT SHORT TERM GOAL #3   Title  activ eLT shoulder flexion incr to  90 degrees    Time  2    Period  Weeks    Status  New      PT SHORT TERM GOAL #4   Title  RT forarm pain decreased 20% or mroe    Time  2    Period  Weeks    Status  New        PT Long Term Goals - 05/07/19 0745      PT LONG  TERM GOAL #1   Title  He will be indpenendt all HEP issued    Time  6    Period  Weeks    Status  New      PT LONG TERM GOAL #2   Title  He will report free painfree ROM of Lt shoulder    Time  6    Period  Weeks    Status  New      PT LONG TERM GOAL #3   Title  He will report pain decreased 75% or more with computer use and lifting    Time  6    Period  Weeks    Status  New      PT LONG TERM GOAL #4   Title  elbow FOTO decrease ot 25% limited    Time  6    Period  Weeks    Status  New            Plan - 05/12/19 0709    Clinical Impression Statement  No incr pain with stretching nad exercises.  mild soreness in RT forearm to start but no pain.    LT shoulde increased pain end range but no lasting pain post.    PT Treatment/Interventions  Taping;Dry needling;Passive range of motion;Manual techniques;Therapeutic exercise;Therapeutic activities;Patient/family education;Iontophoresis 4mg /ml Dexamethasone;Moist Heat;Ultrasound    PT Next Visit Plan  STW to RT forearm , Mobds anmd ROM to LT shoulder, modalites as needed .    PT Home Exercise Plan  strap behind back stretch , corner overhead stretch  doorway ER stretch , wrist flexion steretch    Consulted and Agree with Plan of Care  Patient       Patient will benefit from skilled therapeutic intervention in order  to improve the following deficits and impairments:  Decreased range of motion, Pain, Decreased activity tolerance, Increased muscle spasms, Impaired UE functional use  Visit Diagnosis: Pain in right elbow  Chronic left shoulder pain  Stiffness of left shoulder, not elsewhere classified     Problem List Patient Active Problem List   Diagnosis Date Noted  . Adhesive capsulitis of right shoulder 02/11/2016  . Tendinopathy of left elbow 02/11/2016  . Chronic rhinitis 01/30/2016  . Deviated septum 01/30/2016  . Sinusitis with nasal polyps 01/30/2016    02/01/2016  PT 05/12/2019, 9:01 AM  [facility] [address] [place], [place], [place] Phone: [phone]   Fax:  [phone]  Name: Guy Sanchez MRN: [name] Date of Birth: 03-22-1960

## 2019-05-13 ENCOUNTER — Ambulatory Visit: Payer: BC Managed Care – PPO | Admitting: Family Medicine

## 2019-05-13 ENCOUNTER — Encounter: Payer: Self-pay | Admitting: Family Medicine

## 2019-05-13 VITALS — BP 120/68 | HR 90 | Ht 75.5 in | Wt 199.2 lb

## 2019-05-13 DIAGNOSIS — M67912 Unspecified disorder of synovium and tendon, left shoulder: Secondary | ICD-10-CM

## 2019-05-13 DIAGNOSIS — M7711 Lateral epicondylitis, right elbow: Secondary | ICD-10-CM

## 2019-05-13 NOTE — Patient Instructions (Addendum)
Thank you for coming in today. Continue home exercises and PT.  Recheck with me as needed.  Communicate if not improving enough and we can discuss next steps.  Injection vs further imaging ie MRI.

## 2019-05-13 NOTE — Progress Notes (Signed)
   I, Christoper Fabian, LAT, ATC, am serving as scribe for Dr. Clementeen Graham.  Guy Sanchez is a 59 y.o. male who presents to [facility] today for f/u of R elbow and L shoulder pain.  He was last seen by Dr. Denyse Amass on 04/14/19 and was provided w/ a HEP for L shoulder and RC strengthening and R wrist extensor eccentrics and referred to outpatient PT.  He has completed 2 PT sessions to date.  Since his last visit, pt reports improvement in his symptoms.  He states that his R lateral epicondyle is less tender and notes decreased radiating pain into his L forearm.  He states that his L shoulder also feels better when he dons/doffs his jacket.  He rates his improvement at 50-60% currently.   Pertinent review of systems: No fevers or chills  Relevant historical information: History right shoulder adhesive capsulitis   Exam:  BP 120/68 (BP Location: Left Arm, Patient Position: Sitting, Cuff Size: Large)   Pulse 90   Ht 6' 3.5" (1.918 m)   Wt 199 lb 3.2 oz (90.4 kg)   SpO2 96%   BMI 24.57 kg/m  General: Well Developed, well nourished, and in no acute distress.   MSK: Left shoulder: Normal-appearing normal motion normal strength nontender. Right elbow: Normal-appearing minimally tender lateral epicondyle.  Normal elbow motion.  Normal wrist and finger extension strength.  Slight pain with resisted finger extension.     Assessment and Plan: 59 y.o. male with  Left shoulder rotator cuff tendinitis: Significant improvement with home exercise program and physical therapy.  Patient is about 50% better.  Plan to continue home exercise and PT.  Recheck back as needed.  Next step if not better would be either injection versus MRI.  Right elbow pain: Lateral epicondylitis.  Possible radial tunnel syndrome.  Again significant improvement with PT and home exercise program.  Plan for continued home exercise program and PT.  Recheck back as needed.    Discussed warning signs or  symptoms. Please see discharge instructions. Patient expresses understanding.   The above documentation has been reviewed and is accurate and complete Clementeen Graham   Total encounter time 20 minutes including charting time date of service. Discussed next steps and treatment plan  Will send note to PCP Dr [facility] [address] [place], [place] [address] Phone: [phone] Fax: [phone]

## 2019-05-19 ENCOUNTER — Ambulatory Visit: Payer: BC Managed Care – PPO

## 2019-05-28 ENCOUNTER — Other Ambulatory Visit: Payer: Self-pay

## 2019-05-28 ENCOUNTER — Ambulatory Visit: Payer: BC Managed Care – PPO

## 2019-05-28 DIAGNOSIS — M25521 Pain in right elbow: Secondary | ICD-10-CM | POA: Diagnosis not present

## 2019-05-28 DIAGNOSIS — M25612 Stiffness of left shoulder, not elsewhere classified: Secondary | ICD-10-CM

## 2019-05-28 DIAGNOSIS — M25512 Pain in left shoulder: Secondary | ICD-10-CM

## 2019-05-28 DIAGNOSIS — G8929 Other chronic pain: Secondary | ICD-10-CM

## 2019-05-28 NOTE — Therapy (Signed)
[facility] [address] [place], [place], [address] Phone: [phone]   Fax:  [phone]  Physical Therapy Treatment  Patient Details  Name: Guy Sanchez MRN: [id number] Date of Birth: [birth year] Referring Provider (PT): Clementeen Graham, MD   Encounter Date: 05/28/2019  PT End of Session - 05/28/19 0710    Visit Number  3    Number of Visits  12    Date for PT Re-Evaluation  06/19/19    Authorization Type  BCBS    PT Start Time  0709   pt late   PT Stop Time  0747    PT Time Calculation (min)  38 min    Activity Tolerance  Patient tolerated treatment well    Behavior During Therapy  [facility] for tasks assessed/performed       Past Medical History:  Diagnosis Date  . PONV (postoperative nausea and vomiting)     Past Surgical History:  Procedure Laterality Date  . COLONOSCOPY  01/09/2012   Procedure: COLONOSCOPY;  Surgeon: Malissa Hippo, MD;  [facility];  Service: Endoscopy;  Laterality: N/A;  11:15  . SINUS SURGERY WITH INSTATRAK      There were no vitals filed for this visit.  Subjective Assessment - 05/28/19 0805    Subjective  OVerall better with incr ROM and only soreness RT forearm    Currently in Pain?  No/denies                       [facility] Adult PT Treatment/Exercise - 05/28/19 0001      Shoulder Exercises: Seated   Other Seated Exercises  overhead press 10# bilateral 3 x 10      Shoulder Exercises: ROM/Strengthening   UBE (Upper Arm Bike)  3 min forward and 3 min back    Lat Pull Limitations  3x10 55#    Cybex Press Limitations  3x10 45#    Cybex Row Limitations  3x10 55#      Wrist Exercises   Wrist Extension  Right;10 reps    Bar Weights/Barbell (Wrist Extension)  3 lbs    Wrist Extension Limitations  3 sets with 30 sec stretch wrist extensors      Modalities   Modalities  Moist Heat      Moist Heat Therapy   Number Minutes Moist Heat  --   during shoulder ROM   Moist Heat Location  --   RT forearm     Manual Therapy   Joint Mobilization  LT shoulder inferior and posterior glides    Passive ROM  All directions. Lt shoulder             PT Education - 05/28/19 0803    Education Details  Use of cradle grip for strength exercise with weight /machines and limiting shoulder ROM as prgresses machine strengthening to decr strain to forearm and start 25% of norm to start and progress coautiously    Person(s) Educated  Patient    Methods  Explanation    Comprehension  Verbalized understanding       PT Short Term Goals - 05/28/19 0711      PT SHORT TERM GOAL #1   Title  He will be indpendent with initial hEP    Status  Achieved      PT SHORT TERM GOAL #2   Title  LT shoulder ER increased to 95 degrees active      PT SHORT TERM GOAL #3  Title  activ eLT shoulder flexion incr to  90 degrees      PT SHORT TERM GOAL #4   Title  RT forarm pain decreased 20% or mroe    Status  Achieved        PT Long Term Goals - 05/07/19 0745      PT LONG TERM GOAL #1   Title  He will be indpenendt all HEP issued    Time  6    Period  Weeks    Status  New      PT LONG TERM GOAL #2   Title  He will report free painfree ROM of Lt shoulder    Time  6    Period  Weeks    Status  New      PT LONG TERM GOAL #3   Title  He will report pain decreased 75% or more with computer use and lifting    Time  6    Period  Weeks    Status  New      PT LONG TERM GOAL #4   Title  elbow FOTO decrease ot 25% limited    Time  6    Period  Weeks    Status  New            Plan - 05/28/19 1937    Clinical Impression Statement  Doing better but sore RT forearm  using RT arm for activity. Able to do all activity without incr pain. Progress strength and ROM. ? ionto    PT Treatment/Interventions  Taping;Dry needling;Passive range of motion;Manual techniques;Therapeutic exercise;Therapeutic activities;Patient/family education;Iontophoresis 4mg /ml  Dexamethasone;Moist Heat;Ultrasound    PT Next Visit Plan  STW to RT forearm , Mobs and ROM to LT shoulder, modalites as needed .    PT Home Exercise Plan  strap behind back stretch , corner overhead stretch  doorway ER stretch , wrist flexion steretch    Consulted and Agree with Plan of Care  Patient       Patient will benefit from skilled therapeutic intervention in order to improve the following deficits and impairments:  Decreased range of motion, Pain, Decreased activity tolerance, Increased muscle spasms, Impaired UE functional use  Visit Diagnosis: Chronic left shoulder pain  Pain in right elbow  Stiffness of left shoulder, not elsewhere classified     Problem List Patient Active Problem List   Diagnosis Date Noted  . Chronic rhinitis 01/30/2016  . Deviated septum 01/30/2016  . Sinusitis with nasal polyps 01/30/2016    Darrel Hoover  PT 05/28/2019, 8:08 AM  [facility] [address] [place], [place], [address] Phone: [phone]   Fax:  [phone]  Name: Guy Sanchez MRN: [id number] Date of Birth: 1960/10/07

## 2019-06-02 ENCOUNTER — Other Ambulatory Visit: Payer: Self-pay

## 2019-06-02 ENCOUNTER — Ambulatory Visit: Payer: BC Managed Care – PPO | Admitting: Physical Therapy

## 2019-06-02 ENCOUNTER — Encounter: Payer: Self-pay | Admitting: Physical Therapy

## 2019-06-02 DIAGNOSIS — M25612 Stiffness of left shoulder, not elsewhere classified: Secondary | ICD-10-CM

## 2019-06-02 DIAGNOSIS — M25521 Pain in right elbow: Secondary | ICD-10-CM | POA: Diagnosis not present

## 2019-06-02 DIAGNOSIS — G8929 Other chronic pain: Secondary | ICD-10-CM

## 2019-06-02 NOTE — Therapy (Signed)
[place], [place], [address] Phone: [phone]   Fax:  [phone]  Physical Therapy Treatment / Discharge  Patient Details  Name: Guy Sanchez MRN: [id number] Date of Birth: 01/28/1961 Referring Provider (PT): Lynne Leader, MD   Encounter Date: 06/02/2019  PT End of Session - 06/02/19 0711    Visit Number  4    Number of Visits  12    Date for PT Re-Evaluation  06/19/19    Authorization Type  BC[employer]    PT Start Time  0708    PT Stop Time  0744    PT Time Calculation (min)  36 min    Activity Tolerance  Patient tolerated treatment well    Behavior During Therapy  [facility] for tasks assessed/performed       Past Medical History:  Diagnosis Date  . PONV (postoperative nausea and vomiting)     Past Surgical History:  Procedure Laterality Date  . COLONOSCOPY  01/09/2012   Procedure: COLONOSCOPY;  Surgeon: Rogene Houston, MD;  Location: [facility];  Service: Endoscopy;  Laterality: N/A;  11:15  . SINUS SURGERY WITH INSTATRAK      There were no vitals filed for this visit.  Subjective Assessment - 06/02/19 0708    Subjective  Patient reports no new pain. States shoulder is feeling better but right elbow still bothers him occasionally. He is ready to be done with therapy because he feels he has improved.    Patient Stated Goals  Get rid of pain    Currently in Pain?  No/denies         [facility] PT Assessment - 06/02/19 0001      Assessment   Medical Diagnosis  right elbow , LT shoulder pain    Referring Provider (PT)  Lynne Leader, MD      Observation/Other Assessments   Focus on Therapeutic Outcomes (FOTO)   Shoulder: 17% limitation, Elbow: 26% limitation      AROM   Overall AROM Comments  All shoulder and elbow AROM pain free, shoulder elevation equal bilaterally, still limited in rotational movements on left shoulder      Strength   Overall Strength Comments  WNL equal bilaterally                    [facility] Adult PT Treatment/Exercise - 06/02/19 0001      Shoulder Exercises: Standing   Horizontal ABduction  10 reps   2 sets   Theraband Level (Shoulder Horizontal ABduction)  Level 3 (Green)    External Rotation  10 reps   2 sets   Theraband Level (Shoulder External Rotation)  Level 3 (Green)    Internal Rotation  10 reps   2 sets   Theraband Level (Shoulder Internal Rotation)  Level 3 (Green)    Extension  20 reps   2 sets   Theraband Level (Shoulder Extension)  Level 3 (Green)    Row  20 reps   2 sets   Theraband Level (Shoulder Row)  Level 3 (Green)      Shoulder Exercises: ROM/Strengthening   UBE (Upper Arm Bike)  L3 3 min forward and 3 min back      Shoulder Exercises: Stretch   Corner Stretch  2 reps;10 seconds    Corner Stretch Limitations  Doorway at 90 deg    Wall Stretch - Flexion  2 reps;10 seconds      Wrist Exercises   Wrist Extension  10 reps   2 sets   Bar Weights/Barbell (Wrist Extension)  4 lbs    Other wrist exercises  Wrist extensor stretch 2x10 sec             PT Education - 06/02/19 0709    Education Details  HEP    Person(s) Educated  Patient    Methods  Explanation;Demonstration;Verbal cues    Comprehension  Verbalized understanding;Returned demonstration;Verbal cues required;Need further instruction       PT Short Term Goals - 05/28/19 0711      PT SHORT TERM GOAL #1   Title  He will be indpendent with initial hEP    Status  Achieved      PT SHORT TERM GOAL #2   Title  LT shoulder ER increased to 95 degrees active      PT SHORT TERM GOAL #3   Title  activ eLT shoulder flexion incr to  90 degrees      PT SHORT TERM GOAL #4   Title  RT forarm pain decreased 20% or mroe    Status  Achieved        PT Long Term Goals - 06/02/19 0741      PT LONG TERM GOAL #1   Title  He will be indpenendt all HEP issued    Time  6    Period  Weeks    Status  Achieved      PT LONG TERM GOAL #2   Title  He will  report free painfree ROM of Lt shoulder    Time  6    Period  Weeks    Status  Achieved      PT LONG TERM GOAL #3   Title  He will report pain decreased 75% or more with computer use and lifting    Time  6    Period  Weeks    Status  Achieved      PT LONG TERM GOAL #4   Title  elbow FOTO decrease ot 25% limited    Time  6    Period  Weeks    Status  Partially Met            Plan - 06/02/19 0711    Clinical Impression Statement  Patient progressing well with shoulder/wrist strengthening exercises. Did not report any increase in pain this visit. He continues to report improvement and states that he feels ready to work independently with his exercises and progress. He will be discharged from PT.    PT Treatment/Interventions  Taping;Dry needling;Passive range of motion;Manual techniques;Therapeutic exercise;Therapeutic activities;Patient/family education;Iontophoresis 12m/ml Dexamethasone;Moist Heat;Ultrasound    PT Next Visit Plan  NA    PT Home Exercise Plan  Strap behind back stretch, corner overhead stretch,  doorway ER stretch, wrist flexion stretch,    Consulted and Agree with Plan of Care  Patient       Patient will benefit from skilled therapeutic intervention in order to improve the following deficits and impairments:  Decreased range of motion, Pain, Decreased activity tolerance, Increased muscle spasms, Impaired UE functional use  Visit Diagnosis: Chronic left shoulder pain  Pain in right elbow  Stiffness of left shoulder, not elsewhere classified     Problem List Patient Active Problem List   Diagnosis Date Noted  . Chronic rhinitis 01/30/2016  . Deviated septum 01/30/2016  . Sinusitis with nasal polyps 01/30/2016    [facility] [id number]: [phone]  Fax:  [phone]  Name: Guy Sanchez MRN: [id number] Date of Birth: 04-Feb-1961   PHYSICAL THERAPY DISCHARGE  SUMMARY  Visits from Start of Care: 4  Current functional level related to goals / functional outcomes: See above   Remaining deficits: See above   Education / Equipment: HEP Plan: Patient agrees to discharge.  Patient goals were partially met. Patient is being discharged due to being pleased with the current functional level.  ?????    Hilda Blades, PT, DPT, LAT, ATC 06/02/19  8:00 AM Phone: [phone] Fax: [phone]

## 2020-04-19 ENCOUNTER — Other Ambulatory Visit: Payer: Self-pay | Admitting: Internal Medicine

## 2020-04-19 DIAGNOSIS — E7800 Pure hypercholesterolemia, unspecified: Secondary | ICD-10-CM

## 2020-04-28 ENCOUNTER — Ambulatory Visit: Payer: BC Managed Care – PPO | Admitting: Neurology

## 2020-04-28 ENCOUNTER — Encounter: Payer: Self-pay | Admitting: Neurology

## 2020-04-28 VITALS — BP 118/69 | HR 70 | Ht 75.25 in | Wt 201.0 lb

## 2020-04-28 DIAGNOSIS — F5102 Adjustment insomnia: Secondary | ICD-10-CM

## 2020-04-28 DIAGNOSIS — F518 Other sleep disorders not due to a substance or known physiological condition: Secondary | ICD-10-CM

## 2020-04-28 DIAGNOSIS — R0683 Snoring: Secondary | ICD-10-CM | POA: Diagnosis not present

## 2020-04-28 DIAGNOSIS — Z9889 Other specified postprocedural states: Secondary | ICD-10-CM | POA: Diagnosis not present

## 2020-04-28 MED ORDER — TRAZODONE HCL 50 MG PO TABS: 25.0000 mg | ORAL_TABLET | Freq: Every evening | ORAL | 5 refills | Status: DC | PRN

## 2020-04-28 NOTE — Addendum Note (Signed)
Addended by: Melvyn Novas on: 04/28/2020 04:06 PM   Modules accepted: Orders

## 2020-04-28 NOTE — Patient Instructions (Addendum)
Quality Sleep Information, Adult Quality sleep is important for your mental and physical health. It also improves your quality of life. Quality sleep means you:  Are asleep for most of the time you are in bed.  Fall asleep within 30 minutes.  Wake up no more than once a night.  Are awake for no longer than 20 minutes if you do wake up during the night. Most adults need 7-8 hours of quality sleep each night. How can poor sleep affect me? If you do not get enough quality sleep, you may have:  Mood swings.  Daytime sleepiness.  Confusion.  Decreased reaction time.  Sleep disorders, such as insomnia and sleep apnea.  Difficulty with: ? Solving problems. ? Coping with stress. ? Paying attention. These issues may affect your performance and productivity at work, school, and at home. Lack of sleep may also put you at higher risk for accidents, suicide, and risky behaviors. If you do not get quality sleep you may also be at higher risk for several health problems, including:  Infections.  Type 2 diabetes.  Heart disease.  High blood pressure.  Obesity.  Worsening of long-term conditions, like arthritis, kidney disease, depression, Parkinson's disease, and epilepsy. What actions can I take to get more quality sleep?  Stick to a sleep schedule. Go to sleep and wake up at about the same time each day. Do not try to sleep less on weekdays and make up for lost sleep on weekends. This does not work.  Try to get about 30 minutes of exercise on most days. Do not exercise 2-3 hours before going to bed.  Limit naps during the day to 30 minutes or less.  Do not use any products that contain nicotine or tobacco, such as cigarettes or e-cigarettes. If you need help quitting, ask your health care provider.  Do not drink caffeinated beverages for at least 8 hours before going to bed. Coffee, tea, and some sodas contain caffeine.  Do not drink alcohol close to bedtime.  Do  not eat large meals close to bedtime.  Do not take naps in the late afternoon.  Try to get at least 30 minutes of sunlight every day. Morning sunlight is best.  Make time to relax before bed. Reading, listening to music, or taking a hot bath promotes quality sleep.  Make your bedroom a place that promotes quality sleep. Keep your bedroom dark, quiet, and at a comfortable room temperature. Make sure your bed is comfortable. Take out sleep distractions like TV, a computer, smartphone, and bright lights.  If you are lying awake in bed for longer than 20 minutes, get up and do a relaxing activity until you feel sleepy.  Work with your health care provider to treat medical conditions that may affect sleeping, such as: ? Nasal obstruction. ? Snoring. ? Sleep apnea and other sleep disorders.  Talk to your health care provider if you think any of your prescription medicines may cause you to have difficulty falling or staying asleep.  If you have sleep problems, talk with a sleep consultant. If you think you have a sleep disorder, talk with your health care provider about getting evaluated by a specialist.      Where to find more information  [place] website: https://sleepfoundation.org  National Heart, Lung, and [place] ([place]): http://www.saunders.info/.pdf  Centers for Disease Control and Prevention (CDC): LearningDermatology.pl Contact a health care provider if you:  Have trouble getting to sleep or staying asleep.  Often wake up very early in the morning and cannot get back to sleep.  Have daytime sleepiness.  Have daytime sleep attacks of suddenly falling asleep and sudden muscle weakness (narcolepsy).  Have a tingling sensation in your legs with a strong urge to move your legs (restless legs syndrome).  Stop breathing briefly during sleep (sleep apnea).  Think you have a sleep disorder or are taking a medicine that  is affecting your quality of sleep. Summary  Most adults need 7-8 hours of quality sleep each night.  Getting enough quality sleep is an important part of health and well-being.  Make your bedroom a place that promotes quality sleep and avoid things that may cause you to have poor sleep, such as alcohol, caffeine, smoking, and large meals.  Talk to your health care provider if you have trouble falling asleep or staying asleep. This information is not intended to replace advice given to you by your health care provider. Make sure you discuss any questions you have with your health care provider. Document Revised: 05/29/2017 Document Reviewed: 05/29/2017 Elsevier Patient Education  2021 Elsevier Inc. Insomnia Insomnia is a sleep disorder that makes it difficult to fall asleep or stay asleep. Insomnia can cause fatigue, low energy, difficulty concentrating, mood swings, and poor performance at work or school. There are three different ways to classify insomnia:  Difficulty falling asleep.  Difficulty staying asleep.  Waking up too early in the morning. Any type of insomnia can be long-term (chronic) or short-term (acute). Both are common. Short-term insomnia usually lasts for three months or less. Chronic insomnia occurs at least three times a week for longer than three months. What are the causes? Insomnia may be caused by another condition, situation, or substance, such as:  Anxiety.  Certain medicines.  Gastroesophageal reflux disease (GERD) or other gastrointestinal conditions.  Asthma or other breathing conditions.  Restless legs syndrome, sleep apnea, or other sleep disorders.  Chronic pain.  Menopause.  Stroke.  Abuse of alcohol, tobacco, caffeine or illegal drugs.  Mental health conditions, such as depression.    Neurological disorders, such as Alzheimer's disease.  An overactive thyroid (hyperthyroidism). Sometimes, the cause of insomnia may not be  known. What increases the risk? Risk factors for insomnia include:  Gender. Women are affected more often than men.  Age. Insomnia is more common as you get older.  Stress.  Lack of exercise.  Irregular work schedule or working night shifts.  Traveling between different time zones.  Certain medical and mental health conditions. What are the signs or symptoms? If you have insomnia, the main symptom is having trouble falling asleep or having trouble staying asleep. This may lead to other symptoms, such as:  Feeling fatigued or having low energy.  Feeling nervous about going to sleep.  Not feeling rested in the morning.  Having trouble concentrating.  Feeling irritable, anxious, or depressed. How is this diagnosed? This condition may be diagnosed based on:  Your symptoms and medical history. Your health care provider may ask about: ? Your sleep habits. ? Any medical conditions you have. ? Your mental health.  A physical exam. How is this treated? Treatment for insomnia depends on the cause. Treatment may focus on treating an underlying condition that is causing insomnia. Treatment may also include:  Medicines to help you sleep.  Counseling or therapy.  Lifestyle adjustments to help you sleep better. Follow these instructions at home: Eating and drinking  Limit or avoid alcohol, caffeinated beverages, and cigarettes, especially  close to bedtime. These can disrupt your sleep.  Do not eat a large meal or eat spicy foods right before bedtime. This can lead to digestive discomfort that can make it hard for you to sleep.   Sleep habits  Keep a sleep diary to help you and your health care provider figure out what could be causing your insomnia. Write down: ? When you sleep. ? When you wake up during the night. ? How well you sleep. ? How rested you feel the next day. ? Any side effects of medicines you are taking. ? What you eat and drink.  Make your bedroom a  dark, comfortable place where it is easy to fall asleep. ? Put up shades or blackout curtains to block light from outside. ? Use a white noise machine to block noise. ? Keep the temperature cool.  Limit screen use before bedtime. This includes: ? Watching TV. ? Using your smartphone, tablet, or computer.  Stick to a routine that includes going to bed and waking up at the same times every day and night. This can help you fall asleep faster. Consider making a quiet activity, such as reading, part of your nighttime routine.  Try to avoid taking naps during the day so that you sleep better at night.  Get out of bed if you are still awake after 15 minutes of trying to sleep. Keep the lights down, but try reading or doing a quiet activity. When you feel sleepy, go back to bed.   General instructions  Take over-the-counter and prescription medicines only as told by your health care provider.  Exercise regularly, as told by your health care provider. Avoid exercise starting several hours before bedtime.  Use relaxation techniques to manage stress. Ask your health care provider to suggest some techniques that may work well for you. These may include: ? Breathing exercises. ? Routines to release muscle tension. ? Visualizing peaceful scenes.  Make sure that you drive carefully. Avoid driving if you feel very sleepy.  Keep all follow-up visits as told by your health care provider. This is important. Contact a health care provider if:  You are tired throughout the day.  You have trouble in your daily routine due to sleepiness.  You continue to have sleep problems, or your sleep problems get worse. Get help right away if:  You have serious thoughts about hurting yourself or someone else. If you ever feel like you may hurt yourself or others, or have thoughts about taking your own life, get help right away. You can go to your nearest emergency department or call:  Your local emergency  services (911 in the [place]  A suicide crisis helpline, such as the National Suicide Prevention Lifeline at 336-686-5934. This is open 24 hours a day. Summary  Insomnia is a sleep disorder that makes it difficult to fall asleep or stay asleep.  Insomnia can be long-term (chronic) or short-term (acute).  Treatment for insomnia depends on the cause. Treatment may focus on treating an underlying condition that is causing insomnia.  Keep a sleep diary to help you and your health care provider figure out what could be causing your insomnia. This information is not intended to replace advice given to you by your health care provider. Make sure you discuss any questions you have with your health care provider. Document Revised: 12/31/2019 Document Reviewed: 12/31/2019 Elsevier Patient Education  2021 Elsevier Inc. Trazodone Tablets What is this medicine? TRAZODONE (TRAZ oh done) is used to treat  depression. This medicine may be used for other purposes; ask your health care provider or pharmacist if you have questions. COMMON BRAND NAME(S): Desyrel What should I tell my health care provider before I take this medicine? They need to know if you have any of these conditions:  attempted suicide or thinking about it  bipolar disorder  bleeding problems  glaucoma  heart disease, or previous heart attack  irregular heart beat  kidney or liver disease  low levels of sodium in the blood  an unusual or allergic reaction to trazodone, other medicines, foods, dyes or preservatives  pregnant or trying to get pregnant  breast-feeding How should I use this medicine? Take this medicine by mouth with a glass of water. Follow the directions on the prescription label. Take this medicine shortly after a meal or a light snack. Take your medicine at regular intervals. Do not take your medicine more often than directed. Do not stop taking this medicine suddenly except upon the advice of your  doctor. Stopping this medicine too quickly may cause serious side effects or your condition may worsen. A special MedGuide will be given to you by the pharmacist with each prescription and refill. Be sure to read this information carefully each time. Talk to your pediatrician regarding the use of this medicine in children. Special care may be needed. Overdosage: If you think you have taken too much of this medicine contact a poison control center or emergency room at once. NOTE: This medicine is only for you. Do not share this medicine with others. What if I miss a dose? If you miss a dose, take it as soon as you can. If it is almost time for your next dose, take only that dose. Do not take double or extra doses. What may interact with this medicine? Do not take this medicine with any of the following medications:  certain medicines for fungal infections like fluconazole, itraconazole, ketoconazole, posaconazole, voriconazole  cisapride  dronedarone  linezolid  MAOIs like Carbex, Eldepryl, Marplan, Nardil, and Parnate  mesoridazine  methylene blue (injected into a vein)  pimozide  saquinavir  thioridazine This medicine may also interact with the following medications:  alcohol  antiviral medicines for HIV or AIDS  aspirin and aspirin-like medicines  barbiturates like phenobarbital  certain medicines for blood pressure, heart disease, irregular heart beat  certain medicines for depression, anxiety, or psychotic disturbances  certain medicines for migraine headache like almotriptan, eletriptan, frovatriptan, naratriptan, rizatriptan, sumatriptan, zolmitriptan  certain medicines for seizures like carbamazepine and phenytoin  certain medicines for sleep  certain medicines that treat or prevent blood clots like dalteparin, enoxaparin, warfarin  digoxin  fentanyl  lithium  NSAIDS, medicines for pain and inflammation, like ibuprofen or naproxen  other medicines  that prolong the QT interval (cause an abnormal heart rhythm) like dofetilide  rasagiline  supplements like St. John's wort, kava kava, valerian  tramadol  tryptophan This list may not describe all possible interactions. Give your health care provider a list of all the medicines, herbs, non-prescription drugs, or dietary supplements you use. Also tell them if you smoke, drink alcohol, or use illegal drugs. Some items may interact with your medicine. What should I watch for while using this medicine? Tell your doctor if your symptoms do not get better or if they get worse. Visit your doctor or health care professional for regular checks on your progress. Because it may take several weeks to see the full effects of this medicine, it is important  to continue your treatment as prescribed by your doctor. Patients and their families should watch out for new or worsening thoughts of suicide or depression. Also watch out for sudden changes in feelings such as feeling anxious, agitated, panicky, irritable, hostile, aggressive, impulsive, severely restless, overly excited and hyperactive, or not being able to sleep. If this happens, especially at the beginning of treatment or after a change in dose, call your health care professional. Bonita Quin may get drowsy or dizzy. Do not drive, use machinery, or do anything that needs mental alertness until you know how this medicine affects you. Do not stand or sit up quickly, especially if you are an older patient. This reduces the risk of dizzy or fainting spells. Alcohol may interfere with the effect of this medicine. Avoid alcoholic drinks. This medicine may cause dry eyes and blurred vision. If you wear contact lenses you may feel some discomfort. Lubricating drops may help. See your eye doctor if the problem does not go away or is severe. Your mouth may get dry. Chewing sugarless gum, sucking hard candy and drinking plenty of water may help. Contact your doctor if the  problem does not go away or is severe. What side effects may I notice from receiving this medicine? Side effects that you should report to your doctor or health care professional as soon as possible:  allergic reactions like skin rash, itching or hives, swelling of the face, lips, or tongue  elevated mood, decreased need for sleep, racing thoughts, impulsive behavior  confusion  fast, irregular heartbeat  feeling faint or lightheaded, falls  feeling agitated, angry, or irritable  loss of balance or coordination  painful or prolonged erections  restlessness, pacing, inability to keep still  suicidal thoughts or other mood changes  tremors  trouble sleeping  seizures  unusual bleeding or bruising Side effects that usually do not require medical attention (report to your doctor or health care professional if they continue or are bothersome):  change in sex drive or performance  change in appetite or weight  constipation  headache  muscle aches or pains  nausea This list may not describe all possible side effects. Call your doctor for medical advice about side effects. You may report side effects to FDA at 1-800-FDA-1088. Where should I keep my medicine? Keep out of the reach of children. Store at room temperature between 15 and 30 degrees C (59 to 86 degrees F). Protect from light. Keep container tightly closed. Throw away any unused medicine after the expiration date. NOTE: This sheet is a summary. It may not cover all possible information. If you have questions about this medicine, talk to your doctor, pharmacist, or health care provider.  2021 Elsevier/Gold Standard (2020-01-11 14:46:11)

## 2020-04-28 NOTE — Progress Notes (Signed)
SLEEP MEDICINE CLINIC    Provider:  Melvyn Novasarmen  Hughes Wyndham, MD  Primary Care Physician:  Guy BrunettePharr, Walter, MD [address] [place] [id number]     Referring Provider: Merri BrunettePharr, Walter, Md [address] [place] [id number]          Chief Complaint according to patient   Patient presents with:    . New Patient (Initial Visit) Pt alone, rm 10. Presents today with problems getting good adequate sleep. Patient states that he can fall asleep okay and typically goes to bed around 10 PM. However he finds himself waking up sometimes 1 time sometimes 2 or 3 times a night. States that it can take up to an hour to fall back asleep. Patient states this does not happen nightly but happens very frequently and he feels exhausted during the day.  Never had a sleep study. States he does snore.           HISTORY OF PRESENT ILLNESS:  Guy SaranMichael Sanchez is a 60  year old  Caucasian male patient seen here as a referral on 04/28/2020 from PCP Dr. Renne CriglerPharr for a sleep consultation.  Chief concern according to patient :  less quality sleep for at least 2-3 years, snoring is long standing , sleep fragmentation- his wife sleeps in a different bed. He has snored for at least 20 years.     Guy SaranMichael Sanchez  has a past medical history of Asthma, Depression, and PONV (postoperative nausea and vomiting)..   Sleep relevant medical history: he has sinus surgeries, nasal septal surgery last one in Spring 2021-  Nocturia is non- frequent , it doesn't wake him, had a deviated septum repair.   Family medical /sleep history: brother did sleep walk, sister- on CPAP with OSA,  She has depression, anxiety and insomnia.    Social history: Patient is working as Actoracademic librarian for [employer] and lives in a household with spouse- Family status is married , with one 816 year -old son, 2 cats.  Tobacco use- quit 30 years ago .  ETOH use ; 2 / week . Caffeine intake in form of Coffee( 1 mug a day, in AM )  . Regular exercise in form of walking daily. Early morning sunlight , goes twice a week to Gym.     Sleep habits are as follows: he comes home before 6. The patient's dinner time is 6.30 PM.  The patient goes to bed at 10 PM and immediately asleep-  continues to sleep for 2-3 hours, he wakes up and it not for bathroom breaks, the first time at 3 AM.  He wakes up  2 times a week as early as 1-2 AM and is awake sometimes for 1 hour and sometimes for 12 minutes. Anxiety - he is well aware of it.  The preferred sleep position is supine  , with the support of 1-2 pillows. Dreams are reportedly infrequent.  His wife reports sleep talking.  6 AM is the usual rise time. The patient wakes often up spontaneously. 5.10-AM . .  He reports not feeling refreshed or restored in AM, with symptoms such as dry mouth( even now, after sinus surgery) , and residual fatigue.  Naps are not taken I- he can't fall asleep.    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 .      How likely are you to doze in the following situations: 0 = not likely, 1 = slight chance, 2 = moderate chance, 3 = high chance   Sitting and Reading? Watching Television? Sitting inactive in a public place (theater or meeting)? As a passenger in a car for an hour without a break? Lying down in the afternoon when circumstances permit? Sitting and talking to someone? Sitting quietly after lunch without alcohol? In a car, while stopped for a few minutes in traffic?   Total = 4/ 24 points   FSS endorsed at 36/ 63 points.   Social  History   Socioeconomic History  . Marital status: Married    Spouse name: Not on file  . Number of children: Not on file  . Years of education: Not on file  . Highest education level: Not on file  Occupational History  . Not on file  Tobacco Use  . Smoking status: Former Smoker    Years: 0.50    Quit date: 1993    Years since quitting: 29.1  . Smokeless tobacco: Never Used  Substance and Sexual Activity  . Alcohol use: Yes    Alcohol/week: 1.0 - 2.0 standard drink    Types: 1 - 2 Standard drinks or equivalent per week    Comment: social drinker  . Drug use: No    Comment: marijuana in past  . Sexual activity: Not on file  Other Topics Concern  . Not on file  Social History Narrative  . Not on file   Social Determinants of Health   Financial Resource Strain: Not on file  Food Insecurity: Not on file  Transportation Needs: Not on file  Physical Activity: Not on file  Stress: Not on file  Social Connections: Not on file    Family History  Problem Relation Age of Onset  . Sleep apnea Sister   . Bone cancer Paternal Aunt   . Esophageal cancer Paternal Uncle   . Heart attack Maternal Grandfather   . Stroke Paternal Grandfather     Past Medical History:  Diagnosis Date  . Asthma   . Depression   . PONV (postoperative nausea and vomiting)     Past Surgical History:  Procedure Laterality Date  . COLONOSCOPY  01/09/2012   Procedure: COLONOSCOPY;  Surgeon: Malissa Hippo, MD;  Location: [facility];  Service: Endoscopy;  Laterality: N/A;  11:15  . SINUS SURGERY WITH INSTATRAK       Current Outpatient Medications on File Prior to Visit  Medication Sig Dispense Refill  . fexofenadine (ALLEGRA) 180 MG tablet Take by mouth.    . Multiple Vitamin (MULTIVITAMIN) capsule Take 1 capsule by mouth daily.    . Omega-3 Fatty Acids (OMEGA-3 FISH OIL PO) Take 1 capsule by mouth daily.    . sildenafil (REVATIO) 20 MG tablet 1 tablet     No current facility-administered  medications on file prior to visit.    Allergies  Allergen Reactions  . Aspirin Other (See Comments)    Stomach problems from childhood.    Physical exam:  Today's Vitals   04/28/20 1512  BP: 118/69  Pulse: 70  Weight: 201 lb (91.2 kg)  Height: 6'  3.25" (1.911 m)   Body mass index is 24.96 kg/m.   Wt Readings from Last 3 Encounters:  04/28/20 201 lb (91.2 kg)  05/13/19 199 lb 3.2 oz (90.4 kg)  04/14/19 200 lb 6.4 oz (90.9 kg)     Ht Readings from Last 3 Encounters:  04/28/20 6' 3.25" (1.911 m)  05/13/19 6' 3.5" (1.918 m)  04/14/19 6' 3.5" (1.918 m)      General: The patient is awake, alert and appears not in acute distress. The patient is well groomed. Head: Normocephalic, atraumatic. Neck is supple. Mallampati 1,  neck circumference:17 inches . Nasal airflow is now  patent.  Retrognathia is not seen. Long face- small open.  Dental status: intact  Cardiovascular:  Regular rate and cardiac rhythm by pulse,  without distended neck veins. Respiratory: Lungs are clear to auscultation.  Skin:  Without evidence of ankle edema, or rash. Trunk: The patient's posture is erect.   Neurologic exam : The patient is awake and alert, oriented to place and time.   Memory subjective described as intact.  Attention span & concentration ability appears normal.  Speech is fluent,  without  dysarthria, dysphonia or aphasia.  Mood and affect are appropriate.   Cranial nerves: no loss of smell or taste reported  Pupils are equal and briskly reactive to light.  Funduscopic exam deferred.   Extraocular movements in vertical and horizontal planes were intact and without nystagmus. No Diplopia. Visual fields by finger perimetry are intact. Hearing was intact to soft voice and finger rubbing.    Facial sensation intact to fine touch.  Facial motor strength is symmetric and tongue and uvula move midline.  Neck ROM : rotation, tilt and flexion extension were normal for age and shoulder  shrug was symmetrical.    Motor exam:  Symmetric bulk, tone and ROM.  He has overcome a restricted shoulder movement on the  left.   Normal tone without cog-wheeling, symmetric grip strength .   Sensory:  Fine touch and vibration :  normal.  Proprioception tested in the upper extremities was normal.   Coordination: Rapid alternating movements in the fingers/hands were of normal speed.  The Finger-to-nose maneuver was intact without evidence of ataxia, dysmetria or tremor.   Gait and station: Patient could rise unassisted from a seated position, walked without assistive device.  Stance is of normal width/ base .  Toe and heel walk were deferred.  Deep tendon reflexes: in the  upper and lower extremities are symmetric and intact.  Babinski response was deferred .      After spending a total time of  40  minutes face to face and time for physical and neurologic examination, review of laboratory studies,  personal review of imaging studies, reports and results of other testing and review of referral information / records as far as provided in visit, I have established the following assessments:  1) Mr. Pottinger has developed a pattern of fragmented sleep and is not a daily occurrence but it happens at least 3 nights a week , he will wake up at sometime between 1 and 3 AM and often has difficulties to go back to sleep.  His initial ability to fall asleep has not been impaired.  These early morning arousals or awakenings however are followed up by a protracted.  Of wakefulness and take away the refreshing or restorative quality of nocturnal sleep.  Once he is back asleep he may still experience a second arousal.  The date after those  broken up sleep patterns he is often very exhausted and fatigued.  He does snore loudly and he continued to snore loudly even after he underwent a second sinus surgery 20 years after his first.  His nasal passage at this time seems not to be obstructed and yet he has been  become a habitual mouth breather.  He has not gained a significant amount of weight and also he reports some deconditioning during the first year of the pandemic he has now regained his strength and range of motion back.  He has good sleep habits routines and regular times.  There is no caffeine abuse or use in the afternoon.  He sleeps in the bedroom by himself is not aroused by any other external factors.    My Plan is to proceed with:  1) first suggestion is to treat anxiety - the changes in work environment have contributed to this. .  2) snoring and fragmentation may have been caused by OSA, we will have to screen for OSA.  3) inability to take daytime naps unless sick- this actually helps to concentrate all sleep into the night. .   I would like to thank Guy Brunette, Md [address] [place],  [place] [address] for allowing me to meet with and to take care of this pleasant patient.  In short, Danon Lograsso is presenting with early morning awakenings on 3 nights a week, a form of insomnia.  I plan to follow up either personally or through our NP within 3 month if HST / PSG will have revealed any organic sleep condition.     Electronically signed by: Guy Novas, MD 04/28/2020 3:28 PM  [facility] Board certified by The [employer] of Sleep Medicine and Diplomate of the [employer] of Sleep Medicine. Board certified In Neurology through the ABPN, Fellow of the [employer] of Neurology. Medical Director of [facility].

## 2020-05-11 ENCOUNTER — Ambulatory Visit
Admission: RE | Admit: 2020-05-11 | Discharge: 2020-05-11 | Disposition: A | Discharge status: Home / Self Care | Payer: BC Managed Care – PPO | Source: Ambulatory Visit | Attending: Internal Medicine | Admitting: Internal Medicine

## 2020-05-11 DIAGNOSIS — E7800 Pure hypercholesterolemia, unspecified: Secondary | ICD-10-CM

## 2020-06-01 ENCOUNTER — Ambulatory Visit (INDEPENDENT_AMBULATORY_CARE_PROVIDER_SITE_OTHER): Payer: BC Managed Care – PPO | Admitting: Neurology

## 2020-06-01 DIAGNOSIS — R0683 Snoring: Secondary | ICD-10-CM

## 2020-06-01 DIAGNOSIS — G4733 Obstructive sleep apnea (adult) (pediatric): Secondary | ICD-10-CM | POA: Diagnosis not present

## 2020-06-01 DIAGNOSIS — F5102 Adjustment insomnia: Secondary | ICD-10-CM

## 2020-06-01 DIAGNOSIS — F518 Other sleep disorders not due to a substance or known physiological condition: Secondary | ICD-10-CM

## 2020-06-01 DIAGNOSIS — Z9889 Other specified postprocedural states: Secondary | ICD-10-CM

## 2020-06-02 ENCOUNTER — Encounter: Payer: Self-pay | Admitting: Neurology

## 2020-06-09 NOTE — Progress Notes (Signed)
   [facility]  HOME SLEEP TEST ( HST by Watch PAT)  STUDY DATA LOADED: 06-09-20  DOB: 07-08-1960  MRN: [id number]  ORDERING CLINICIAN: Melvyn Novas, MD   REFERRING CLINICIAN: Merri Brunette, MD   CLINICAL INFORMATION/HISTORY: Guy Sanchez a 60  year old Caucasian patientseen upon PCP referralon 04/28/2020 . Dr. Renne Crigler requested a sleep consultation.  Chiefconcernaccording to patient : 'less quality of sleep for at least 2-3 years, snoring is long standing, more sleep fragmentation- his wife sleeps in a different bed. He has snored for at" least 20 years".  [name] has a medical history of Asthma, Anxiety and Depression, and PONV (postoperative nausea and vomiting), had a deviated nasal septum repaired, wakes up for unknown reasons, 2-3 times each night. He also can't nap, as he has trouble falling asleep.   Epworth sleepiness score: 4/24.  BMI: 26.6 kg/m  Neck Circumference: 17"  FINDINGS:   Total Record Time (hours, min): 7 h 28 min Total Sleep Time (hours, min):  6 h 9 min   Percent REM (%):    25.71 %   Calculated pAHI (per hour): 6.1      REM pAHI: 12.9  NREM pAHI: 4.2 Supine AHI: 7.8   Oxygen Saturation (%) Mean: 62  Minimum oxygen saturation (%):        82   O2 Saturation Range (%): 82-98  O2Saturation (minutes) <=88%: 0.1 min  Pulse Mean (bpm):    82  Pulse Range (62-98)   IMPRESSION: This HST detected mild  OSA (obstructive sleep apnea) at an AHI of 6.1/h , REM apnea at 12.9/h ( still considered mild),RDI was 18.2/h in REM sleep, that indicates loud snoring for about 25% of sleep rime.  There was no clinically significant hypoxemia. Heart rate variability appeared normal.     RECOMMENDATION:  We have to address the patient 's reported high fatigue in correlation with mild apnea, loud snoring. The treatment would be CPAP first - and if not tolerated- we can change to dental device for snoring treatment - it wont do as much for apnea in REM  sleep.  I suggest an auto CPAP device  with heated humidification, with setting of 7-15 cm water, 3 cm EPR and a mask of patient's comfort.    INTERPRETING PHYSICIAN:  Melvyn Novas, MD  [facility] and [facility] Sleep Board certified by The [employer] of Sleep Medicine and Diplomate of the [employer] of Sleep Medicine. Board certified In Neurology through the ABPN, Fellow of the [employer] of Neurology. Medical Director of [facility].

## 2020-06-15 ENCOUNTER — Other Ambulatory Visit: Payer: Self-pay | Admitting: Internal Medicine

## 2020-06-15 DIAGNOSIS — R932 Abnormal findings on diagnostic imaging of liver and biliary tract: Secondary | ICD-10-CM

## 2020-06-15 DIAGNOSIS — Z9889 Other specified postprocedural states: Secondary | ICD-10-CM | POA: Insufficient documentation

## 2020-06-15 DIAGNOSIS — R0683 Snoring: Secondary | ICD-10-CM | POA: Insufficient documentation

## 2020-06-15 DIAGNOSIS — F518 Other sleep disorders not due to a substance or known physiological condition: Secondary | ICD-10-CM | POA: Insufficient documentation

## 2020-06-15 NOTE — Addendum Note (Signed)
Addended by: Melvyn Novas on: 06/15/2020 03:37 PM   Modules accepted: Orders

## 2020-06-15 NOTE — Progress Notes (Signed)
IMPRESSION: This HST detected mild  OSA (obstructive sleep apnea) at an AHI of 6.1/h , REM apnea at 12.9/h ( still considered mild),RDI was 18.2/h in REM sleep, that indicates loud snoring for about 25% of sleep rime.  There was no clinically significant hypoxemia. Heart rate variability appeared normal.     RECOMMENDATION:  We have to address the patient 's reported high fatigue in correlation with mild apnea, loud snoring. The treatment would be CPAP first - and if not tolerated- we can change to dental device for snoring treatment - it wont do as much for apnea in REM sleep.  I suggest an auto CPAP device  with heated humidification, with setting of 7-15 cm water, 3 cm EPR and a mask of patient's comfort.    INTERPRETING PHYSICIAN:  Melvyn Novas, MD [facility]

## 2020-06-16 ENCOUNTER — Telehealth: Payer: Self-pay | Admitting: Neurology

## 2020-06-16 ENCOUNTER — Encounter: Payer: Self-pay | Admitting: Neurology

## 2020-06-16 NOTE — Telephone Encounter (Signed)
Called patient to discuss sleep study results. No answer at this time. LVM for the patient to call back.  Will send a mychart message as well. 

## 2020-06-16 NOTE — Telephone Encounter (Signed)
-----   Message from Melvyn Novas, MD sent at 06/15/2020  3:37 PM EDT ----- IMPRESSION: This HST detected mild  OSA (obstructive sleep apnea) at an AHI of 6.1/h , REM apnea at 12.9/h ( still considered mild),RDI was 18.2/h in REM sleep, that indicates loud snoring for about 25% of sleep rime.  There was no clinically significant hypoxemia. Heart rate variability appeared normal.     RECOMMENDATION:  We have to address the patient 's reported high fatigue in correlation with mild apnea, loud snoring. The treatment would be CPAP first - and if not tolerated- we can change to dental device for snoring treatment - it wont do as much for apnea in REM sleep.  I suggest an auto CPAP device  with heated humidification, with setting of 7-15 cm water, 3 cm EPR and a mask of patient's comfort.    INTERPRETING PHYSICIAN:  Melvyn Novas, MD [facility]

## 2020-06-21 NOTE — Telephone Encounter (Signed)
I called pt. I advised pt that Dr. Vickey Huger reviewed their sleep study results and found that pt mild apnea. Dr. Vickey Huger recommends that pt starts . I reviewed PAP compliance expectations with the pt. Pt is agreeable to starting a CPAP. I advised pt that an order will be sent to a DME, [facility] and [facility] will call the pt within about one week after they file with the pt's insurance. [facility]) will show the pt how to use the machine, fit for masks, and troubleshoot the CPAP if needed. A follow up appt was made for insurance purposes with Butch Penny, NP on Aug 29,2022 at 8:30 am. Pt verbalized understanding to arrive 15 minutes early and bring their CPAP. A letter with all of this information in it will be mailed to the pt as a reminder. I verified with the pt that the address we have on file is correct. Pt verbalized understanding of results. Pt had no questions at this time but was encouraged to call back if questions arise. I have sent the order to [facility])  and have received confirmation that they have received the order.

## 2020-07-01 ENCOUNTER — Encounter: Payer: Self-pay | Admitting: Neurology

## 2020-07-07 ENCOUNTER — Ambulatory Visit
Admission: RE | Admit: 2020-07-07 | Discharge: 2020-07-07 | Disposition: A | Discharge status: Home / Self Care | Payer: BC Managed Care – PPO | Source: Ambulatory Visit | Attending: Internal Medicine | Admitting: Internal Medicine

## 2020-07-07 DIAGNOSIS — R932 Abnormal findings on diagnostic imaging of liver and biliary tract: Secondary | ICD-10-CM

## 2020-09-29 ENCOUNTER — Other Ambulatory Visit: Payer: Self-pay

## 2020-09-29 ENCOUNTER — Ambulatory Visit: Payer: BC Managed Care – PPO | Admitting: Internal Medicine

## 2020-09-29 ENCOUNTER — Encounter: Payer: Self-pay | Admitting: Internal Medicine

## 2020-09-29 VITALS — BP 110/64 | HR 73 | Ht 75.0 in | Wt 200.0 lb

## 2020-09-29 DIAGNOSIS — G4733 Obstructive sleep apnea (adult) (pediatric): Secondary | ICD-10-CM | POA: Diagnosis not present

## 2020-09-29 DIAGNOSIS — E7800 Pure hypercholesterolemia, unspecified: Secondary | ICD-10-CM | POA: Diagnosis not present

## 2020-09-29 DIAGNOSIS — I2584 Coronary atherosclerosis due to calcified coronary lesion: Secondary | ICD-10-CM

## 2020-09-29 DIAGNOSIS — I2510 Atherosclerotic heart disease of native coronary artery without angina pectoris: Secondary | ICD-10-CM

## 2020-09-29 NOTE — Patient Instructions (Signed)
Medication Instructions:  No Changes In Medications at this time.  *If you need a refill on your cardiac medications before your next appointment, please call your pharmacy*  Follow-Up: At [facility], you and your health needs are our priority.  As part of our continuing mission to provide you with exceptional heart care, we have created designated Provider Care Teams.  These Care Teams include your primary Cardiologist (physician) and Advanced Practice Providers (APPs -  Physician Assistants and Nurse Practitioners) who all work together to provide you with the care you need, when you need it.  Your next appointment:   3 month(s)  The format for your next appointment:   In Person  Provider:   Weston Brass, MD  Other Instructions PLEASE SCHEDULE APPOINTMENT WITH [facility]) TO DISCUSS [id number]

## 2020-09-29 NOTE — Progress Notes (Signed)
Cardiology Office Note:    Date:  09/29/2020   ID:  Guy Sanchez, DOB 06-Feb-1961, MRN [id number]  PCP:  Merri Brunette, MD  Cardiologist:  Parke Poisson, MD  Electrophysiologist:  None   Referring MD: Merri Brunette, MD   Chief Complaint/Reason for Referral: Coronary artery calcifications  History of Present Illness:    Guy Sanchez is a 60 y.o. male with a history of asthma, here for initial evaluation of coronary artery calcifications and risk stratification.  Today, we discussed in detail the results of his Coronary Calcium Score in 05/2020, which revealed a score of 117.6 in the 75th percentile.   He is not as active as he wants to be but is still quite active. For the last 3-4 years he has been strength training with free weights. He was exercising 3 times a week, but lately is going 2 times a week due to work time constraints. 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 Occasionally he takes day trips to go hiking. Also he walks in the morning as often as possible, for a distance of just under a mile to 1.75 miles. He continues to mow his lawn with a push mower. Typically he rests while exercising to keep himself hydrated. He denies being limited by shortness of breath or other exertional chest pain or other symptoms. However, he did suffer a knee injury, which prevents him from running for exercise anymore.  He endorses sleep apnea, which he was dx with early this spring. He has been fatigued for years and often snores. A request for a CPAP was made, but he is still waiting on the device. He intends to use the CPAP as soon as he is able. We discussed CV implications of untreated sleep apnea  He reports his cholesterol has gradually increased over the last 12 years. His most recent labs show HDL 35, LDL 132, Trig 87. His PCP started him on crestor 20 mg daily, but after a month the medication he was noted to have elevated LFTs and this was stopped. He was then placed on  ezetimibe, which he has been taking for about a month. We discussed that zetia in monotherapy may not be effect enough, and we discussed PCSK9I in detail.   In his family, his mother and father both take cholesterol medication. His mother has an 88-90% blockage in her carotid artery. His maternal grandfather had a heart attack at 34 yo or earlier (unsure of age).  Of note, occasionally he has numbness in his big toe on the left LE.   He denies any chest pain, or palpitations. No headaches, lightheadedness, or syncope to report. Also has no lower extremity edema, orthopnea or PND.   Past Medical History:  Diagnosis Date   Asthma    Depression    PONV (postoperative nausea and vomiting)     Past Surgical History:  Procedure Laterality Date   COLONOSCOPY  01/09/2012   Procedure: COLONOSCOPY;  Surgeon: Malissa Hippo, MD;  [facility];  Service: Endoscopy;  Laterality: N/A;  11:15   SINUS SURGERY WITH INSTATRAK      Current Medications: Current Meds  Medication Sig   ASPIRIN 81 PO Take 162 mg by mouth daily.   ezetimibe (ZETIA) 10 MG tablet Take 10 mg by mouth daily.   fexofenadine (ALLEGRA) 180 MG tablet Take by mouth.   Multiple Vitamin (MULTIVITAMIN) capsule Take 1 capsule by mouth daily.   Omega-3 Fatty Acids (OMEGA-3 FISH OIL PO) Take  1 capsule by mouth daily.   sildenafil (REVATIO) 20 MG tablet 1 tablet   traZODone (DESYREL) 50 MG tablet Take 0.5-1 tablets (25-50 mg total) by mouth at bedtime as needed for sleep.     Allergies:   Patient has no active allergies.   Social History   Tobacco Use   Smoking status: Former    Years: 0.50    Types: Cigarettes    Quit date: 1993    Years since quitting: 29.5   Smokeless tobacco: Never  Substance Use Topics   Alcohol use: Yes    Alcohol/week: 1.0 - 2.0 standard drink    Types: 1 - 2 Standard drinks or equivalent per week    Comment: social drinker   Drug use: No    Comment: marijuana in past     Family  History: The patient's family history includes Bone cancer in his paternal aunt; Esophageal cancer in his paternal uncle; Heart attack in his maternal grandfather; Sleep apnea in his sister; Stroke in his paternal grandfather.  ROS:   Please see the history of present illness.    (+) Snores All other systems reviewed and are negative.  EKGs/Labs/Other Studies Reviewed:    The following studies were reviewed today:  CT Cardiac Scoring 05/11/2020: IMPRESSION: 1. Coronary artery calcium score of 117.6. This places the patient in the 75th percentile for age, gender and race/ethnicity. 2. Lobulated low-attenuation structure in the dome of the liver measuring 3.0 x 2.8 cm shows low attenuation but is incompletely imaged. No comparison imaging is available. This may represent a lobulated cyst or hemangioma but is not imaged in its entirety on the current exam. Consider initial evaluation with abdominal sonogram for further assessment.  EKG: 09/29/2020: Sinus rhythm, possible left atrial enlargement  I have independently reviewed the images from CT cor cal, 05/11/20. Cor cal noted in prox, mid and distal RCA, and in distal LM/proximal and mid LAD.   Recent Labs: No results found for requested labs within last 8760 hours.  Recent Lipid Panel No results found for: CHOL, TRIG, HDL, CHOLHDL, VLDL, LDLCALC, LDLDIRECT  Physical Exam:    VS:  BP 110/64 (BP Location: Left Arm, Patient Position: Sitting, Cuff Size: Normal)   Pulse 73   Ht 6\' 3"  (1.905 m)   Wt 200 lb (90.7 kg)   BMI 25.00 kg/m     Wt Readings from Last 5 Encounters:  09/29/20 200 lb (90.7 kg)  04/28/20 201 lb (91.2 kg)  05/13/19 199 lb 3.2 oz (90.4 kg)  04/14/19 200 lb 6.4 oz (90.9 kg)  02/10/16 191 lb (86.6 kg)    Constitutional: No acute distress Eyes: sclera non-icteric, normal conjunctiva and lids ENMT: normal dentition, moist mucous membranes Cardiovascular: regular rhythm, normal rate, no murmurs. S1 and S2 normal.  Radial pulses normal bilaterally. No jugular venous distention.  Respiratory: clear to auscultation bilaterally GI : normal bowel sounds, soft and nontender. No distention.   MSK: extremities warm, well perfused. No edema.  NEURO: grossly nonfocal exam, moves all extremities. PSYCH: alert and oriented x 3, normal mood and affect.   ASSESSMENT:    1. Coronary artery calcification   2. Pure hypercholesterolemia   3. OSA (obstructive sleep apnea)    PLAN:    Coronary artery calcification - Plan: EKG 12-Lead Pure hypercholesterolemia - Intermediate risk calcium score in the setting of hyperlipidemia. Goal LDL <70. Agree with medication therapy, did not tolerate statin. Will refer to CV[facility] for discussion of PCSK9I due to statin  intolerance due to elevated LFTs. - agree with use of ASA 81 mg daily. We dicussed class II recommendation from guidelines, he is amenable to taking.  - recommend Mediterranean diet. - anticipating repeat lab work next week, we should review this when available.  OSA (obstructive sleep apnea) - encouraged treatment for OSA when his device arrives.  Total time of encounter: 60 minutes total time of encounter, including 35 minutes spent in face-to-face patient care on the date of this encounter. This time includes coordination of care and counseling regarding above mentioned problem list. Remainder of non-face-to-face time involved reviewing chart documents/testing relevant to the patient encounter and documentation in the medical record. I have independently reviewed documentation from referring provider.    Weston Brass, MD, [facility] [place]  [facility]   Medication Adjustments/Labs and Tests Ordered: Current medicines are reviewed at length with the patient today.  Concerns regarding medicines are outlined above.   Orders Placed This Encounter  Procedures   EKG 12-Lead    No orders of the defined types were placed in this encounter.   Patient  Instructions  Medication Instructions:  No Changes In Medications at this time.  *If you need a refill on your cardiac medications before your next appointment, please call your pharmacy*  Follow-Up: At [facility], you and your health needs are our priority.  As part of our continuing mission to provide you with exceptional heart care, we have created designated Provider Care Teams.  These Care Teams include your primary Cardiologist (physician) and Advanced Practice Providers (APPs -  Physician Assistants and Nurse Practitioners) who all work together to provide you with the care you need, when you need it.  Your next appointment:   3 month(s)  The format for your next appointment:   In Person  Provider:   Weston Brass, MD  Other Instructions PLEASE SCHEDULE APPOINTMENT WITH CVRR (LIPID CLINIC) TO DISCUSS PCSK9    I,Mathew Stumpf,acting as a scribe for Parke Poisson, MD.,have documented all relevant documentation on the behalf of Parke Poisson, MD,as directed by  Parke Poisson, MD while in the presence of Parke Poisson, MD.  I, Parke Poisson, MD, have reviewed all documentation for this visit. The documentation on 09/29/20 for the exam, diagnosis, procedures, and orders are all accurate and complete.

## 2020-10-21 ENCOUNTER — Ambulatory Visit: Payer: BC Managed Care – PPO | Admitting: Pharmacist

## 2020-10-21 ENCOUNTER — Telehealth: Payer: Self-pay | Admitting: Pharmacist

## 2020-10-21 ENCOUNTER — Encounter: Payer: Self-pay | Admitting: Pharmacist

## 2020-10-21 ENCOUNTER — Other Ambulatory Visit: Payer: Self-pay

## 2020-10-21 VITALS — BP 124/72 | HR 70 | Resp 15 | Ht 75.5 in | Wt 195.8 lb

## 2020-10-21 DIAGNOSIS — I2510 Atherosclerotic heart disease of native coronary artery without angina pectoris: Secondary | ICD-10-CM | POA: Diagnosis not present

## 2020-10-21 DIAGNOSIS — E7800 Pure hypercholesterolemia, unspecified: Secondary | ICD-10-CM | POA: Insufficient documentation

## 2020-10-21 DIAGNOSIS — Z8659 Personal history of other mental and behavioral disorders: Secondary | ICD-10-CM | POA: Insufficient documentation

## 2020-10-21 DIAGNOSIS — M545 Low back pain, unspecified: Secondary | ICD-10-CM | POA: Insufficient documentation

## 2020-10-21 DIAGNOSIS — N529 Male erectile dysfunction, unspecified: Secondary | ICD-10-CM | POA: Insufficient documentation

## 2020-10-21 DIAGNOSIS — Z8709 Personal history of other diseases of the respiratory system: Secondary | ICD-10-CM | POA: Insufficient documentation

## 2020-10-21 DIAGNOSIS — N281 Cyst of kidney, acquired: Secondary | ICD-10-CM | POA: Insufficient documentation

## 2020-10-21 MED ORDER — PRALUENT 150 MG/ML ~~LOC~~ SOAJ: 150.0000 mg | SUBCUTANEOUS | 11 refills | Status: DC

## 2020-10-21 NOTE — Patient Instructions (Signed)
It was nice meeting you today!  We would like your LDL (bad cholesterol) to be less than 70  Continue your ezetimibe (Zetia) 10 mg once a day  Continue your heart healthy diet  We will start a new medication that you will inject once every 2 weeks  We will complete the prior authorization for you and activate a copay card.  We will call you when it is approved.  We will then recheck your cholesterol in about 2-3 months  Please call with any questions!  Laural Golden, PharmD, BCACP, CDCES, CPP [facility] [address] [place], [place] [address] Phone: [phone]; Fax: [phone] 10/21/2020 9:45 AM

## 2020-10-21 NOTE — Telephone Encounter (Signed)
Called and spoke to pt and stated that they were approved for praluent 150mg , rx sent, pt instructed to apply for a copay card and to complete fasting labs after 4th dose

## 2020-10-21 NOTE — Telephone Encounter (Signed)
Guy Sanchez (Key: [id number]) pa submitted for the praluent 150mg 

## 2020-10-21 NOTE — Progress Notes (Signed)
Patient ID: Guy Sanchez                 DOB: [birth year]                    MRN: [id number]     HPI: Guy Sanchez is a 60 y.o. male patient referred to lipid clinic by Dr Jacques Navy. PMH is significant for CAD, HLD and asthma.  Patient seen by Dr Jacques Navy on 7/28 to discuss elevated coronary calcium score.  Patient presents today in good spirits.  Works as a Comptroller at [employer].  Tries to be physcially active.  Goes to the gym twice a week and has begun riding his bike more often.  Last weekend biked for over 2 hours.  Upon Dr Lupe Carney recommendation has been transitioning towards the Mediterranean diet and cutting down on portion sizes.  Has lost 5 pounds in past month.  Has a family history of CAD.  Maternal grandfather had an MI at a young age.  PCP had started him on rosuvastatin but was discontinued due to elevated LFTs.  Now is managed on Zetia 10mg  daily.  Reports no adverse effects.  PCP recently drew lipid panel however results not currently available in [facility].  Current Medications: Zetia 10mg  Intolerances: rosuvastatin (elevated LFTs) Risk Factors: Family history, coronary calcium score  Coronary artery calcium score of 117.6. This places the patient in the 75th percentile for age, gender and race/ethnicity.  LDL goal: <70  Labs: TC 191, Trigs 85, HDL 35, 140 (04/08/20)  Past Medical History:  Diagnosis Date   Asthma    Depression    PONV (postoperative nausea and vomiting)     Current Outpatient Medications on File Prior to Visit  Medication Sig Dispense Refill   ASPIRIN 81 PO Take 162 mg by mouth daily.     ezetimibe (ZETIA) 10 MG tablet Take 10 mg by mouth daily.     fexofenadine (ALLEGRA) 180 MG tablet Take by mouth.     Multiple Vitamin (MULTIVITAMIN) capsule Take 1 capsule by mouth daily.     Omega-3 Fatty Acids (OMEGA-3 FISH OIL PO) Take 1 capsule by mouth daily.     sildenafil (REVATIO) 20 MG tablet 1 tablet     traZODone (DESYREL) 50 MG tablet Take 0.5-1  tablets (25-50 mg total) by mouth at bedtime as needed for sleep. 30 tablet 5   No current facility-administered medications on file prior to visit.    No Active Allergies  Assessment/Plan:  1. Hyperlipidemia - Patient LDL 140 which is above goal of <70.  Was recently updated but results not available at this time.    Patient has been working on lifestyle changes including increasing exercise and improving diet.  Unlikely to reach LDL goal on Zetia alone so recommend starting PCSK9i which patient is agreeable to.  Using demo pen, educated patient on mechanism of action, storage, site selection, possible side effects, and administration.  Patient was able to demonstrate use in room using demo.  Will complete PA and activate copay card.  Recheck lipid panel in 2-3 months.  Continue Zetia 10mg  daily Start Repatha/Praluent Q 14 days Recheck lipid panel in 2-3 months  , PharmD, BCACP, CDCES, CPP [facility] [address] [place], [facility] [address] Phone: [phone]; Fax: [phone] 10/21/2020 10:08 AM

## 2020-10-21 NOTE — Telephone Encounter (Signed)
Please complete prior authorization for:  Name of medication, dose, and frequency Repatha 140mg  SQ Q 14d or Praluent 150mg  SQ Q 14 days  Lab Orders Requested? yes  Which labs? Lipid panel  Estimated date for labs to be scheduled 2-3 months  Does patient need activated copay card? yes

## 2020-10-21 NOTE — Addendum Note (Signed)
Addended by: Eather Colas on: 10/21/2020 02:24 PM   Modules accepted: Orders

## 2020-11-26 ENCOUNTER — Other Ambulatory Visit: Payer: Self-pay | Admitting: Neurology

## 2020-11-28 NOTE — Telephone Encounter (Signed)
Patient is up to date on his appointments. Pt is due for a refill on trazodone. Dr. Vickey Huger is out of the office. Will send to [facility].

## 2020-12-06 ENCOUNTER — Telehealth: Payer: Self-pay | Admitting: Internal Medicine

## 2020-12-06 ENCOUNTER — Other Ambulatory Visit: Payer: Self-pay | Admitting: *Deleted

## 2020-12-06 DIAGNOSIS — E7800 Pure hypercholesterolemia, unspecified: Secondary | ICD-10-CM

## 2020-12-06 NOTE — Telephone Encounter (Signed)
New Message:    Patient said Dr Jacques Navy said he could have his lab work at his family doctor(Dr Consulting civil engineer). He said he went there this morning, but they did not have an order for him to get his lab work. He needs this asap today, so she can ask to get off from work tomorrow.

## 2020-12-06 NOTE — Telephone Encounter (Signed)
Spoke with pt, lab paperwork faxed to dr pharr at [phone].

## 2020-12-16 ENCOUNTER — Other Ambulatory Visit: Payer: Self-pay

## 2020-12-16 ENCOUNTER — Ambulatory Visit: Payer: BC Managed Care – PPO | Admitting: Internal Medicine

## 2020-12-16 ENCOUNTER — Encounter: Payer: Self-pay | Admitting: Internal Medicine

## 2020-12-16 VITALS — BP 117/71 | HR 57 | Ht 75.0 in | Wt 195.0 lb

## 2020-12-16 DIAGNOSIS — I2510 Atherosclerotic heart disease of native coronary artery without angina pectoris: Secondary | ICD-10-CM

## 2020-12-16 DIAGNOSIS — G4733 Obstructive sleep apnea (adult) (pediatric): Secondary | ICD-10-CM | POA: Diagnosis not present

## 2020-12-16 DIAGNOSIS — E7800 Pure hypercholesterolemia, unspecified: Secondary | ICD-10-CM | POA: Diagnosis not present

## 2020-12-16 DIAGNOSIS — I2584 Coronary atherosclerosis due to calcified coronary lesion: Secondary | ICD-10-CM | POA: Diagnosis not present

## 2020-12-16 NOTE — Progress Notes (Signed)
Cardiology Office Note:    Date:  12/16/2020   ID:  Guy Sanchez, DOB [birth year], MRN [id number]  PCP:  Merri Brunette, MD  Cardiologist:  Parke Poisson, MD  Electrophysiologist:  None   Referring MD: Merri Brunette, MD   Chief Complaint/Reason for Referral: Coronary artery calcifications  History of Present Illness:    Guy Sanchez is a 60 y.o. male with a history of asthma, and depression, here for follow-up. He was initially seen 09/29/2020 for initial evaluation of coronary artery calcifications and risk stratification.  12/16/2020: Overall, he states he is feeling 120% better. His quality of sleep is gradually improving. He is not eating past 7 pm and is using his CPAP every night.   For exercise he is trying to walk regularly. A few days a week at lunchtime he climbs 3 flights of stairs multiple times for a short exercise. Has also started a new routine at the gym for the past three weeks. A few times a week he performs a mixture of high and low intensity cardio exercises with some strength training. This is part of a 7-week plan with 1 hour exercises on the weekend with either walking or cycling. He denies any significant exertional symptoms with normal workouts. However, he has noticed minor chest tightness with the more strenuous high-intensity workouts. Of note, he did run about 3 miles on a treadmill, and asks if he would be clear to begin training for a 5k run in late November or December. We discussed paced exercise and reporting to my office any concerning symptoms.  For his diet, he has mostly been complying with the changes made at his last appointment. He has been working on cutting back on butter. At home he notes having lost some weight.   His most recent lab results from his appointment with Dr. Renne Crigler show: Triglycerides 42, HDL 48, LDL 36 A metabolic panel was also done which he believes was mostly normal. About 1.5 months ago he began taking Repatha.  The patient  denies chest pain, dyspnea at rest or with exertion, palpitations, PND, orthopnea, or leg swelling. Denies cough, fever, chills. Denies nausea, vomiting. Denies syncope or presyncope. Denies dizziness or lightheadedness.   Past Medical History:  Diagnosis Date   Asthma    Depression    PONV (postoperative nausea and vomiting)     Past Surgical History:  Procedure Laterality Date   COLONOSCOPY  01/09/2012   Procedure: COLONOSCOPY;  Surgeon: Malissa Hippo, MD;  [facility];  Service: Endoscopy;  Laterality: N/A;  11:15   SINUS SURGERY WITH INSTATRAK      Current Medications: Current Meds  Medication Sig   Alirocumab (PRALUENT) 150 MG/ML SOAJ Inject 150 mg into the skin every 14 (fourteen) days.   aspirin EC 81 MG tablet Take 81 mg by mouth daily. Swallow whole.   ezetimibe (ZETIA) 10 MG tablet Take 10 mg by mouth daily.   Multiple Vitamin (MULTIVITAMIN) capsule Take 1 capsule by mouth daily.   sildenafil (REVATIO) 20 MG tablet 1 tablet   SODIUM CHLORIDE, EXTERNAL, (SALJET) 0.9 % SOLN Apply topically.   traZODone (DESYREL) 50 MG tablet TAKE 1/2 TO 1 TABLET BY MOUTH AT BEDTIME AS NEEDED FOR SLEEP     Allergies:   Crestor [rosuvastatin calcium]   Social History   Tobacco Use   Smoking status: Former    Years: 0.50    Types: Cigarettes    Quit date: 1993    Years since quitting: 29.8  Smokeless tobacco: Never  Substance Use Topics   Alcohol use: Yes    Alcohol/week: 1.0 - 2.0 standard drink    Types: 1 - 2 Standard drinks or equivalent per week    Comment: social drinker   Drug use: No    Comment: marijuana in past     Family History: The patient's family history includes Bone cancer in his paternal aunt; Esophageal cancer in his paternal uncle; Heart attack in his maternal grandfather; Sleep apnea in his sister; Stroke in his paternal grandfather.  ROS:   Please see the history of present illness.    (+) Minor chest tightness with heavy exertion All  other systems reviewed and are negative.  EKGs/Labs/Other Studies Reviewed:    The following studies were reviewed today:  CT Cardiac Scoring 05/11/2020: IMPRESSION: 1. Coronary artery calcium score of 117.6. This places the patient in the 75th percentile for age, gender and race/ethnicity. 2. Lobulated low-attenuation structure in the dome of the liver measuring 3.0 x 2.8 cm shows low attenuation but is incompletely imaged. No comparison imaging is available. This may represent a lobulated cyst or hemangioma but is not imaged in its entirety on the current exam. Consider initial evaluation with abdominal sonogram for further assessment.  EKG: 12/16/2020: EKG was not ordered today. 09/29/2020: Sinus rhythm, possible left atrial enlargement  I have independently reviewed the images from CT cor cal, 05/11/20. Cor cal noted in prox, mid and distal RCA, and in distal LM/proximal and mid LAD.   Recent Labs: No results found for requested labs within last 8760 hours.   Recent Lipid Panel No results found for: CHOL, TRIG, HDL, CHOLHDL, VLDL, LDLCALC, LDLDIRECT  Physical Exam:    VS:  BP 117/71   Pulse (!) 57   Ht 6\' 3"  (1.905 m)   Wt 195 lb (88.5 kg)   SpO2 94%   BMI 24.37 kg/m     Wt Readings from Last 5 Encounters:  12/16/20 195 lb (88.5 kg)  10/21/20 195 lb 12.8 oz (88.8 kg)  09/29/20 200 lb (90.7 kg)  04/28/20 201 lb (91.2 kg)  05/13/19 199 lb 3.2 oz (90.4 kg)    Constitutional: No acute distress Eyes: sclera non-icteric, normal conjunctiva and lids ENMT: normal dentition, moist mucous membranes Cardiovascular: regular rhythm, normal rate, no murmurs. S1 and S2 normal. Radial pulses normal bilaterally. No jugular venous distention.  Respiratory: clear to auscultation bilaterally GI : normal bowel sounds, soft and nontender. No distention.   MSK: extremities warm, well perfused. No edema.  NEURO: grossly nonfocal exam, moves all extremities. PSYCH: alert and oriented x 3,  normal mood and affect.   ASSESSMENT:    1. Coronary artery calcification   2. Hypercholesterolemia   3. Atherosclerosis of native coronary artery of native heart without angina pectoris   4. OSA (obstructive sleep apnea)     PLAN:    Coronary artery calcification - Plan: EKG 12-Lead Pure hypercholesterolemia - Intermediate risk calcium score in the setting of hyperlipidemia. Goal LDL <70. Agree with medication therapy, did not tolerate statin. Started praluent, tolerating well. - continue ASA 81 mg daily. We dicussed class II recommendation from guidelines, he is amenable to taking.  - recommend Mediterranean diet. - labs excellent, continue praluent.  OSA (obstructive sleep apnea) - tolerating therapy and feeling quite well. Continue.   Follow-up in 6 months.   Total time of encounter: 30 minutes total time of encounter, including 20 minutes spent in face-to-face patient care on the date of  this encounter. This time includes coordination of care and counseling regarding above mentioned problem list. Remainder of non-face-to-face time involved reviewing chart documents/testing relevant to the patient encounter and documentation in the medical record. I have independently reviewed documentation from referring provider.    Weston Brass, MD, [facility]   [facility]   Medication Adjustments/Labs and Tests Ordered: Current medicines are reviewed at length with the patient today.  Concerns regarding medicines are outlined above.   No orders of the defined types were placed in this encounter.    No orders of the defined types were placed in this encounter.    Patient Instructions  Medication Instructions:  No Changes In Medications at this time.  *If you need a refill on your cardiac medications before your next appointment, please call your pharmacy*  Follow-Up: At [facility], you and your health needs are our priority.  As part of our continuing mission to  provide you with exceptional heart care, we have created designated Provider Care Teams.  These Care Teams include your primary Cardiologist (physician) and Advanced Practice Providers (APPs -  Physician Assistants and Nurse Practitioners) who all work together to provide you with the care you need, when you need it.  Your next appointment:   6 month(s)  The format for your next appointment:   In Person  Provider:   Weston Brass, MD  Please call us with any issues ask for Cleotilde Neer RN ) [phone]    [facility] Stumpf,acting as a scribe for Parke Poisson, MD.,have documented all relevant documentation on the behalf of Parke Poisson, MD,as directed by  Parke Poisson, MD while in the presence of Parke Poisson, MD.  I, Parke Poisson, MD, have reviewed all documentation for this visit. The documentation on 12/16/20 for the exam, diagnosis, procedures, and orders are all accurate and complete.

## 2020-12-16 NOTE — Patient Instructions (Addendum)
Medication Instructions:  No Changes In Medications at this time.  *If you need a refill on your cardiac medications before your next appointment, please call your pharmacy*  Follow-Up: At [facility], you and your health needs are our priority.  As part of our continuing mission to provide you with exceptional heart care, we have created designated Provider Care Teams.  These Care Teams include your primary Cardiologist (physician) and Advanced Practice Providers (APPs -  Physician Assistants and Nurse Practitioners) who all work together to provide you with the care you need, when you need it.  Your next appointment:   6 month(s)  The format for your next appointment:   In Person  Provider:   Weston Brass, MD  Please call us with any issues ask for Cleotilde Neer RN ) [phone]

## 2021-01-03 ENCOUNTER — Ambulatory Visit: Payer: BC Managed Care – PPO | Admitting: Neurology

## 2021-01-06 ENCOUNTER — Encounter: Payer: Self-pay | Admitting: Neurology

## 2021-01-06 ENCOUNTER — Ambulatory Visit: Payer: BC Managed Care – PPO | Admitting: Neurology

## 2021-01-06 VITALS — BP 112/59 | HR 64 | Ht 75.0 in | Wt 195.0 lb

## 2021-01-06 DIAGNOSIS — R0683 Snoring: Secondary | ICD-10-CM

## 2021-01-06 DIAGNOSIS — G4733 Obstructive sleep apnea (adult) (pediatric): Secondary | ICD-10-CM

## 2021-01-06 DIAGNOSIS — Z9889 Other specified postprocedural states: Secondary | ICD-10-CM

## 2021-01-06 NOTE — Progress Notes (Signed)
SLEEP MEDICINE CLINIC    Provider:  Melvyn Novas, MD  Primary Care Physician:  Merri Brunette, MD [address] [place] [address]     Referring Provider: Merri Brunette, Md [address] [place],  [place] [address]          Chief Complaint according to patient   Patient presents with:     New Patient (Initial Visit) Pt alone, rm 10. Marland Kitchen Here for initial CPAP follow up.              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  There was no significant low oxygen levels and there was also a normal heart rate variability.  We suggested an auto CPAP device for which the patient unfortunately had to wait very long he was just set up on 10-21-2020, The home sleep test revealed about 25% REM sleep which is a good proportion.  Now using the auto titration device the patient has been highly compliant he used the machine 29 out of 30 days with over 4 hours on average the usage hours were 6.3 hours per night his settings are 7-15 cmH2O there has been no expiratory pressure relief set.  His 95th percentile pressure at night is 8.9 cmH2O we consider any pressure under 10 cm below.  He does have a moderate air leak 95th percentile air leak is 14 L/min.  His residual AHI is 2.1 and consists of apneas and hypopneas but no central apneas are emerging.  The highest and outlying air leak data performed 10-20 and 10-21 as well as for the 24th and 25 October ; all other days he seems to have had very little air leakage.    He had some difficulties with supplies from [employer], and he received several times wrong supplies even  after he gave the correct serial numbers.  His wife still sleeps in a different room, but his snoring is resolved.     RESVENT : FFM - ?  [place] 2 points.      from PCP Dr. Renne Crigler for a sleep consultation.  Chief concern according to patient :  less quality sleep for at least 2-3 years, snoring is long standing , sleep fragmentation- his wife sleeps in a different bed. He has snored for at least 20 years.     Guy Sanchez  has a past medical history of Asthma, Depression, and PONV (postoperative nausea and vomiting)..   Sleep relevant medical history: he has sinus surgeries, nasal septal surgery last one in Spring 2021-  Nocturia is non- frequent , it doesn't wake him, had a deviated septum repair.   Family medical /sleep history: brother did sleep walk, sister- on CPAP with OSA,  She has depression, anxiety and insomnia.    Social history: Patient is working as Actor for [employer] and lives in a household with spouse- Family status is married , with one 45 year -old son, 2 cats.  Tobacco use- quit 30 years ago .  ETOH use ; 2 / week . Caffeine intake in form of  Coffee( 1 mug a day, in AM ) . Regular exercise in form of walking daily. Early morning sunlight , goes twice a week to Gym.     Sleep habits are as follows: he comes home before 6. The patient's dinner time is 6.30 PM.  The patient goes to bed at 10 PM and immediately asleep-  continues to sleep for 2-3 hours, he wakes up and it not for bathroom breaks, the first time at 3 AM.  He wakes up  2 times a week as early as 1-2 AM and is awake sometimes for 1 hour and sometimes for 12 minutes. Anxiety - he is well aware of it.  The preferred sleep position is supine , with the support of 1-2 pillows.  Dreams are reportedly more frequent, since CPAP.   His wife reports sleep talking.  6 AM is the usual rise time. The patient wakes often up spontaneously. 5.10-AM . .  He reports not feeling refreshed or restored in AM, with  symptoms such as dry mouth( even now, after sinus surgery) , and residual fatigue. Naps are not taken I- he can't fall asleep.    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 .      How likely are you to doze in the following situations: 0 = not likely, 1 = slight chance, 2 = moderate chance, 3 = high chance   Sitting and Reading? Watching Television? Sitting inactive in a public place (theater or meeting)? As a passenger in a car for an hour without a break? Lying down in the afternoon when circumstances permit? Sitting and talking to someone? Sitting quietly after lunch without alcohol? In a car, while stopped for a few minutes in traffic?   Total = 2 from  4/ 24 points   FSS endorsed at  19 from 36/ 63 points.   Social History   Socioeconomic History   Marital status: Married    Spouse name: Not on file   Number of children: Not on file   Years of education: Not on file   Highest education level: Not on file  Occupational History   Not on file  Tobacco Use   Smoking status: Former    Years: 0.50    Types: Cigarettes    Quit date: 64    Years since quitting: 29.8   Smokeless tobacco: Never  Substance and Sexual Activity   Alcohol use: Yes    Alcohol/week: 1.0 - 2.0 standard drink    Types: 1 - 2 Standard drinks or equivalent per week    Comment: social drinker   Drug use: No    Comment: marijuana in past   Sexual activity: Not on file  Other Topics Concern   Not on file  Social History Narrative   Not on file   Social Determinants of Health    Financial Resource Strain: Not on file  Food Insecurity: Not on file  Transportation Needs: Not on file  Physical Activity: Not on file  Stress: Not on file  Social Connections: Not on file  Family History  Problem Relation Age of Onset   Sleep apnea Sister    Bone cancer Paternal Aunt    Esophageal cancer Paternal Uncle    Heart attack Maternal Grandfather    Stroke Paternal Grandfather     Past Medical History:  Diagnosis Date   Asthma    Depression    PONV (postoperative nausea and vomiting)     Past Surgical History:  Procedure Laterality Date   COLONOSCOPY  01/09/2012   Procedure: COLONOSCOPY;  Surgeon: Malissa Hippo, MD;  Location: [facility];  Service: Endoscopy;  Laterality: N/A;  11:15   SINUS SURGERY WITH INSTATRAK       Current Outpatient Medications on File Prior to Visit  Medication Sig Dispense Refill   Alirocumab (PRALUENT) 150 MG/ML SOAJ Inject 150 mg into the skin every 14 (fourteen) days. 2 mL 11   aspirin EC 81 MG tablet Take 81 mg by mouth daily. Swallow whole.     ezetimibe (ZETIA) 10 MG tablet Take 10 mg by mouth daily.     fexofenadine (ALLEGRA) 180 MG tablet Take 180 mg by mouth as needed.     fluticasone (FLONASE) 50 MCG/ACT nasal spray Place 1 spray into both nostrils as needed.     Multiple Vitamin (MULTIVITAMIN) capsule Take 1 capsule by mouth daily.     sildenafil (REVATIO) 20 MG tablet Take 20 mg by mouth as needed.     SODIUM CHLORIDE, EXTERNAL, (SALJET) 0.9 % SOLN Apply topically.     traZODone (DESYREL) 50 MG tablet TAKE 1/2 TO 1 TABLET BY MOUTH AT BEDTIME AS NEEDED FOR SLEEP 30 tablet 5   No current facility-administered medications on file prior to visit.    Allergies  Allergen Reactions   Crestor [Rosuvastatin Calcium]     Elevated LFTs    Physical exam:  Today's Vitals   01/06/21 0951  BP: (!) 112/59  Pulse: 64  Weight: 195 lb (88.5 kg)  Height: 6\' 3"  (1.905 m)   Body mass index is 24.37 kg/m.   Wt Readings  from Last 3 Encounters:  01/06/21 195 lb (88.5 kg)  12/16/20 195 lb (88.5 kg)  10/21/20 195 lb 12.8 oz (88.8 kg)     Ht Readings from Last 3 Encounters:  01/06/21 6\' 3"  (1.905 m)  12/16/20 6\' 3"  (1.905 m)  10/21/20 6' 3.5" (1.918 m)      General: The patient is awake, alert and appears not in acute distress. The patient is well groomed. Head: Normocephalic, atraumatic. Neck is supple. Mallampati 1,  neck circumference:17 inches . Nasal airflow is now  patent.  Retrognathia is not seen. Long face- small open.  Dental status: intact  Cardiovascular:  Regular rate and cardiac rhythm by pulse,  without distended neck veins. Respiratory: Lungs are clear to auscultation.  Skin:  Without evidence of ankle edema, or rash. Trunk: The patient's posture is erect.   Neurologic exam : The patient is awake and alert, oriented to place and time.   Memory subjective described as intact.  Attention span & concentration ability appears normal.  Speech is fluent,  without  dysarthria, dysphonia or aphasia.  Mood and affect are appropriate.   Cranial nerves: no loss of smell or taste reported  Pupils are equal and briskly reactive to light.  Funduscopic exam deferred.   Extraocular movements in vertical and horizontal planes were intact and without nystagmus. No Diplopia. Visual fields by finger perimetry are intact. Hearing was intact to soft voice and finger  rubbing.    Facial sensation intact to fine touch.  Facial motor strength is symmetric and tongue and uvula move midline.  Neck ROM : rotation, tilt and flexion extension were normal for age and shoulder shrug was symmetrical.    Motor exam:  Symmetric bulk, tone and ROM.  He has overcome a restricted shoulder movement on the  left.   Normal tone without cog-wheeling, symmetric grip strength .     After spending a total time of  22  minutes face to face and time for physical and neurologic examination, review of laboratory studies,   personal review of imaging studies, reports and results of other testing and review of referral information / records as far as provided in visit, I have established the following assessments:  1) Mr. Shortall has very mild apnea, and CPAP cut down to a residual AHI of 2.1/h. he is much less fatigued, and he feels refreshed in AM, snoring has resolved.   2) had developed a pattern of fragmented sleep and is not a daily occurrence but it happens at least 3 nights a week , he will wake up at sometime between 1 and 3 AM and often has difficulties to go back to sleep.  This is improved , he is also enrolled in cardiac exercises - it has helped to achieve better sleep .     My Plan is to proceed with: DME : please give patient a fitting for  a [employer] FFM.- Or similar  ResMED F 30 I   RV in 12 months.    I would like to thank Merri Brunette, Md [address] [place],  [place] [address] for allowing me to meet with and to take care of this pleasant patient.     Electronically signed by: Melvyn Novas, MD 01/06/2021 10:29 AM  [facility] Board certified by The [employer] of Sleep Medicine and Diplomate of the [employer] of Sleep Medicine. Board certified In Neurology through the ABPN, Fellow of the [employer] of Neurology. Medical Director of [facility].

## 2021-01-24 ENCOUNTER — Ambulatory Visit: Payer: BC Managed Care – PPO | Admitting: Neurology

## 2021-01-30 ENCOUNTER — Encounter: Payer: Self-pay | Admitting: Neurology

## 2021-01-30 ENCOUNTER — Other Ambulatory Visit: Payer: Self-pay | Admitting: Neurology

## 2021-03-08 LAB — LIPID PANEL
Chol/HDL Ratio: 2.3 ratio (ref 0.0–5.0)
Cholesterol, Total: 98 mg/dL — ABNORMAL LOW (ref 100–199)
HDL: 42 mg/dL (ref >39)
LDL Chol Calc (NIH): 42 mg/dL (ref 0–99)
Triglycerides: 59 mg/dL (ref 0–149)
VLDL Cholesterol Cal: 14 mg/dL (ref 5–40)

## 2021-03-09 ENCOUNTER — Telehealth: Payer: Self-pay | Admitting: Pharmacist

## 2021-03-09 NOTE — Telephone Encounter (Signed)
Spoke with patient re: lipid panel.  LDL much improved. Will continue [id number]

## 2021-04-13 ENCOUNTER — Telehealth: Payer: Self-pay | Admitting: Internal Medicine

## 2021-04-13 NOTE — Telephone Encounter (Signed)
Spoke with patient/ gave information.

## 2021-04-14 ENCOUNTER — Encounter: Payer: Self-pay | Admitting: Internal Medicine

## 2021-06-13 ENCOUNTER — Emergency Department (HOSPITAL_COMMUNITY)
Admission: EM | Admit: 2021-06-13 | Discharge: 2021-06-14 | Disposition: A | Discharge status: Home / Self Care | Payer: BC Managed Care – PPO | Attending: Emergency Medicine | Admitting: Emergency Medicine

## 2021-06-13 ENCOUNTER — Encounter (HOSPITAL_COMMUNITY): Payer: Self-pay | Admitting: Emergency Medicine

## 2021-06-13 ENCOUNTER — Emergency Department (HOSPITAL_COMMUNITY): Payer: BC Managed Care – PPO

## 2021-06-13 DIAGNOSIS — R509 Fever, unspecified: Secondary | ICD-10-CM | POA: Diagnosis not present

## 2021-06-13 DIAGNOSIS — H02401 Unspecified ptosis of right eyelid: Secondary | ICD-10-CM | POA: Insufficient documentation

## 2021-06-13 DIAGNOSIS — J45909 Unspecified asthma, uncomplicated: Secondary | ICD-10-CM | POA: Diagnosis not present

## 2021-06-13 DIAGNOSIS — Z7982 Long term (current) use of aspirin: Secondary | ICD-10-CM | POA: Diagnosis not present

## 2021-06-13 LAB — COMPREHENSIVE METABOLIC PANEL
ALT: 16 U/L (ref 0–44)
AST: 18 U/L (ref 15–41)
Albumin: 3.9 g/dL (ref 3.5–5.0)
Alkaline Phosphatase: 73 U/L (ref 38–126)
Anion gap: 9 (ref 5–15)
BUN: 11 mg/dL (ref 6–20)
CO2: 27 mmol/L (ref 22–32)
Calcium: 9 mg/dL (ref 8.9–10.3)
Chloride: 102 mmol/L (ref 98–111)
Creatinine, Ser: 0.9 mg/dL (ref 0.61–1.24)
GFR, Estimated: >60 mL/min (ref >60)
Glucose, Bld: 100 mg/dL — ABNORMAL HIGH (ref 70–99)
Potassium: 4.2 mmol/L (ref 3.5–5.1)
Sodium: 138 mmol/L (ref 135–145)
Total Bilirubin: 0.7 mg/dL (ref 0.3–1.2)
Total Protein: 7.4 g/dL (ref 6.5–8.1)

## 2021-06-13 LAB — CBC
HCT: 45 % (ref 39.0–52.0)
Hemoglobin: 14.7 g/dL (ref 13.0–17.0)
MCH: 29.8 pg (ref 26.0–34.0)
MCHC: 32.7 g/dL (ref 30.0–36.0)
MCV: 91.3 fL (ref 80.0–100.0)
Platelets: 324 10*3/uL (ref 150–400)
RBC: 4.93 MIL/uL (ref 4.22–5.81)
RDW: 11.9 % (ref 11.5–15.5)
WBC: 10.7 10*3/uL — ABNORMAL HIGH (ref 4.0–10.5)
nRBC: 0 % (ref 0.0–0.2)

## 2021-06-13 LAB — PROTIME-INR
INR: 1 (ref 0.8–1.2)
Prothrombin Time: 13.3 seconds (ref 11.4–15.2)

## 2021-06-13 LAB — DIFFERENTIAL
Abs Immature Granulocytes: 0.04 10*3/uL (ref 0.00–0.07)
Basophils Absolute: 0 10*3/uL (ref 0.0–0.1)
Basophils Relative: 0 %
Eosinophils Absolute: 0.4 10*3/uL (ref 0.0–0.5)
Eosinophils Relative: 4 %
Immature Granulocytes: 0 %
Lymphocytes Relative: 16 %
Lymphs Abs: 1.7 10*3/uL (ref 0.7–4.0)
Monocytes Absolute: 1.4 10*3/uL — ABNORMAL HIGH (ref 0.1–1.0)
Monocytes Relative: 13 %
Neutro Abs: 7.2 10*3/uL (ref 1.7–7.7)
Neutrophils Relative %: 67 %

## 2021-06-13 LAB — I-STAT CHEM 8, ED
BUN: 12 mg/dL (ref 6–20)
Calcium, Ion: 1.16 mmol/L (ref 1.15–1.40)
Chloride: 100 mmol/L (ref 98–111)
Creatinine, Ser: 0.8 mg/dL (ref 0.61–1.24)
Glucose, Bld: 93 mg/dL (ref 70–99)
HCT: 47 % (ref 39.0–52.0)
Hemoglobin: 16 g/dL (ref 13.0–17.0)
Potassium: 4.3 mmol/L (ref 3.5–5.1)
Sodium: 139 mmol/L (ref 135–145)
TCO2: 30 mmol/L (ref 22–32)

## 2021-06-13 LAB — APTT: aPTT: 32 seconds (ref 24–36)

## 2021-06-13 MED ORDER — GADOBUTROL 1 MMOL/ML IV SOLN
9.0000 mL | Freq: Once | INTRAVENOUS | Status: AC | PRN
  Administered 2021-06-13: 9 mL via INTRAVENOUS

## 2021-06-13 MED ORDER — IOHEXOL 350 MG/ML SOLN
75.0000 mL | Freq: Once | INTRAVENOUS | Status: AC | PRN
  Administered 2021-06-13: 75 mL via INTRAVENOUS

## 2021-06-13 NOTE — ED Triage Notes (Addendum)
Patient from ophthalmologist for evaluation of possible Horner syndrome and request for evaluation for carotid dissection. Patient denies pain. Patient has ptosis in right eye that was present on waking at 0600 this morning. No extremity weakness, patient is alert and oriented and in no apparent distress at this time. ?

## 2021-06-13 NOTE — ED Provider Notes (Signed)
?[facility] EMERGENCY DEPARTMENT ?Provider Note ? ? ?CSN: [id number] ?Arrival date & time: 06/13/21  1700 ? ?  ? ?History ? ?Chief Complaint  ?Patient presents with  ? Eye Problem  ? ? ?Guy Sanchez is a 61 y.o. male. With past medical history of asthma who presents to the emergency department for ptosis.  ? ?Patient states he woke up this morning, went to the bathroom and put his glasses on and noticed that he had sagging of his right eyelid. He states he then went to work and contacted his PCP who requested he come to office. From PCP he was sent directly to Dr. Dione Booze with ophthalmology who requested that he immediately present to ED for rule out Horner syndrome.  ? ?Patient states he has had viral URI since last Tuesday. States his fevers stopped on Friday but has had profuse coughing all weekend. He denies headache, visual changes. Denies neck pain or trauma. Denies nausea or vomiting. Denies weakness of extremities, numbness or tingling to the face, extremities. Denies slurred speech.  ? ?HPI ? ?  ? ?Home Medications ?Prior to Admission medications   ?Medication Sig Start Date End Date Taking? Authorizing Provider  ?Alirocumab (PRALUENT) 150 MG/ML SOAJ Inject 150 mg into the skin every 14 (fourteen) days. 10/21/20   Parke Poisson, MD  ?aspirin EC 81 MG tablet Take 81 mg by mouth daily. Swallow whole.    [provider]  ?ezetimibe (ZETIA) 10 MG tablet Take 10 mg by mouth daily.    [provider]  ?fexofenadine (ALLEGRA) 180 MG tablet Take 180 mg by mouth as needed.    [provider]  ?fluticasone (FLONASE) 50 MCG/ACT nasal spray Place 1 spray into both nostrils as needed.    [provider]  ?Multiple Vitamin (MULTIVITAMIN) capsule Take 1 capsule by mouth daily.    [provider]  ?sildenafil (REVATIO) 20 MG tablet Take 20 mg by mouth as needed. 02/11/18   [provider]  ?SODIUM CHLORIDE, EXTERNAL, (SALJET) 0.9 % SOLN Apply  topically.    [provider]  ?   ? ?Allergies    ?Crestor [rosuvastatin calcium]   ? ?Review of Systems   ?Review of Systems  ?Eyes:  Negative for photophobia, pain and visual disturbance.  ?     Ptosis   ?Respiratory:  Positive for cough.   ?Musculoskeletal:  Negative for neck pain.  ?Neurological:  Negative for headaches.  ?All other systems reviewed and are negative. ? ?Physical Exam ?Updated Vital Signs ?BP 138/81 (BP Location: Left Arm)   Pulse 70   Temp 99.3 ?F (37.4 ?C) (Oral)   Resp 16   SpO2 97%  ?Physical Exam ?Vitals and nursing note reviewed.  ?Constitutional:   ?   General: He is not in acute distress. ?   Appearance: He is not ill-appearing or toxic-appearing.  ?HENT:  ?   Head: Normocephalic and atraumatic.  ?   Nose: Nose normal.  ?   Mouth/Throat:  ?   Mouth: Mucous membranes are moist.  ?   Pharynx: Oropharynx is clear.  ?Eyes:  ?   General: Vision grossly intact. Gaze aligned appropriately. No visual field deficit or scleral icterus.    ?   Right eye: No discharge.     ?   Left eye: No discharge.  ?   Extraocular Movements: Extraocular movements intact.  ?   Right eye: No nystagmus.  ?   Left eye: No nystagmus.  ?  Pupils: Pupils are unequal.  ?   Right eye: Pupil is sluggish.  ?   Visual Fields: Right eye visual fields normal and left eye visual fields normal.  ?   Comments: Left pupil 54mm, right 79mm  ? ?Ptosis of right eyelid  ?Neck:  ?   Vascular: No carotid bruit.  ?Cardiovascular:  ?   Rate and Rhythm: Normal rate and regular rhythm.  ?   Pulses: Normal pulses.  ?   Heart sounds: Normal heart sounds. No murmur heard. ?Pulmonary:  ?   Effort: Pulmonary effort is normal. No respiratory distress.  ?   Breath sounds: Normal breath sounds.  ?Abdominal:  ?   Palpations: Abdomen is soft.  ?   Tenderness: There is no abdominal tenderness.  ?Musculoskeletal:     ?   General: Normal range of motion.  ?   Cervical back: Normal range of motion and neck supple. No rigidity or tenderness.   ?Skin: ?   General: Skin is warm and dry.  ?   Capillary Refill: Capillary refill takes less than 2 seconds.  ?Neurological:  ?   Mental Status: He is alert and oriented to person, place, and time.  ?   GCS: GCS eye subscore is 4. GCS verbal subscore is 5. GCS motor subscore is 6.  ?   Cranial Nerves: Cranial nerve deficit present. No dysarthria.  ?   Sensory: Sensation is intact.  ?   Motor: Motor function is intact. No weakness or pronator drift.  ?   Coordination: Coordination is intact. Finger-Nose-Finger Test normal.  ?   Comments: Ptosis of right eyelid   ?Psychiatric:     ?   Mood and Affect: Mood normal.     ?   Behavior: Behavior normal.     ?   Thought Content: Thought content normal.     ?   Judgment: Judgment normal.  ? ? ?ED Results / Procedures / Treatments   ?Labs ?(all labs ordered are listed, but only abnormal results are displayed) ?Labs Reviewed  ?CBC - Abnormal; Notable for the following components:  ?    Result Value  ? WBC 10.7 (*)   ? All other components within normal limits  ?DIFFERENTIAL - Abnormal; Notable for the following components:  ? Monocytes Absolute 1.4 (*)   ? All other components within normal limits  ?COMPREHENSIVE METABOLIC PANEL - Abnormal; Notable for the following components:  ? Glucose, Bld 100 (*)   ? All other components within normal limits  ?PROTIME-INR  ?APTT  ?I-STAT CHEM 8, ED  ? ?EKG ?EKG Interpretation ? ?Date/Time:  Tuesday June 13 2021 18:19:42 EDT ?Ventricular Rate:  71 ?PR Interval:  162 ?QRS Duration: 78 ?QT Interval:  370 ?QTC Calculation: 402 ?R Axis:   95 ?Text Interpretation: Normal sinus rhythm Right atrial enlargement Rightward axis Borderline ECG No previous ECGs available Confirmed by Marianna Fuss ([id number]) on 06/13/2021 10:15:25 PM ? ?Radiology ?CT ANGIO HEAD NECK W WO CM ? 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 EXAM: CT ANGIOGRAPHY HEAD AND NECK TECHNIQUE: Multidetector CT imaging of the  head and neck was performed using the standard protocol during bolus administration of intravenous contrast. Multiplanar CT image reconstructions and MIPs were obtained to evaluate the vascular anatomy. Carotid stenosis measurements (when applicable) are obtained utilizing NASCET criteria, using the distal internal carotid diameter as the denominator. RADIATION DOSE REDUCTION: This exam was performed according to the departmental dose-optimization program which includes  automated exposure control, adjustment of the mA and/or kV according to patient size and/or use of iterative reconstruction technique. CONTRAST:  75mL OMNIPAQUE IOHEXOL 350 MG/ML SOLN COMPARISON:  None. FINDINGS: CT HEAD FINDINGS Brain: Cerebral volume within normal limits. No acute intracranial hemorrhage. No visible acute large vessel territory infarct. No mass lesion or midline shift. No hydrocephalus or extra-axial fluid collection. Vascular: No hyperdense vessel. Skull: Scalp soft tissues within normal limits.  Calvarium intact. Sinuses: Moderate mucosal thickening in opacity seen throughout the frontoethmoidal and maxillary sinuses with sequelae of prior sinus surgery. No mastoid effusion. Orbits: Globes orbital soft tissues demonstrate no acute finding. Review of the MIP images confirms the above findings CTA NECK FINDINGS Aortic arch: Visualized aortic arch normal in caliber with normal branch pattern. No stenosis about the origin of the great vessels. Right carotid system: Right common and internal carotid arteries widely patent without stenosis, dissection or occlusion. Left carotid system: Left common and internal carotid arteries widely patent without stenosis, dissection or occlusion. Vertebral arteries: Both vertebral arteries arise from the subclavian arteries. No proximal subclavian artery stenosis. Both vertebral arteries widely patent without stenosis, dissection or occlusion. Skeleton: No discrete or worrisome osseous lesions.  Other neck: No other acute soft tissue abnormality within the neck. Upper chest: Visualized upper chest demonstrates no acute finding. Review of the MIP images confirms the above findings CTA HEAD FINDINGS Anterior ci

## 2021-06-13 NOTE — Discharge Instructions (Addendum)
You were seen in the emergency department today for sagging of your eyelid.  You may have a condition called Horner syndrome.  We performed a CT of your head and neck as well as an MRI of your brain which were both normal.  We spoke with neurology who request that you have a ophthalmology follow-up.  I have sent a referral to ophthalmology.  I am also providing you with a local ophthalmologist.  Please call in 3 days if you do not receive a phone call.  Please return to the emergency department for worsening symptoms such as change to your vision, slurred speech, weakness or numbness or tingling to your arms or legs, severe headache. ? ? ?You were also treated for sinus infection with Augmentin. ?

## 2021-06-13 NOTE — ED Provider Triage Note (Signed)
Emergency Medicine Provider Triage Evaluation Note ? ?[name] , a 61 y.o. male  was evaluated in triage.  Pt complains of sent from Dr. Laruth Bouchard office with concern for acute right-sided horners syndrome, and recommendation to rule out right-sided carotid dissection. ?Patient states he has had sinusitis recently however he feels like that pressure is much improved. ? ? ? ?Physical Exam  ?BP 138/81 (BP Location: Left Arm)   Pulse 70   Temp 99.3 ?F (37.4 ?C) (Oral)   Resp 16   SpO2 97%  ?Gen:   Awake, no distress   ?Resp:  Normal effort  ?MSK:   Moves extremities without difficulty, 5/5 strength bilateral arms and legs. ?Other:  Right-sided ptosis, miosis.  ? ?Medical Decision Making  ?Medically screening exam initiated at 6:26 PM.  Appropriate orders placed.  M[facility] Odoherty was informed that the remainder of the evaluation will be completed by another provider, this initial triage assessment does not replace that evaluation, and the importance of remaining in the ED until their evaluation is complete. ? ? ?  ?Cristina Gong, [place] ?06/13/21 1829 ? ?

## 2021-06-13 NOTE — ED Notes (Signed)
Pt gone to MRI 

## 2021-06-14 ENCOUNTER — Encounter: Payer: Self-pay | Admitting: Neurology

## 2021-06-14 MED ORDER — AMOXICILLIN-POT CLAVULANATE 875-125 MG PO TABS: 1.0000 | ORAL_TABLET | Freq: Two times a day (BID) | ORAL | 0 refills | Status: DC

## 2021-06-14 MED ORDER — AMOXICILLIN-POT CLAVULANATE 875-125 MG PO TABS
1.0000 | ORAL_TABLET | Freq: Once | ORAL | Status: AC
  Administered 2021-06-14: 1 via ORAL
  Filled 2021-06-14: qty 1

## 2021-07-27 ENCOUNTER — Ambulatory Visit: Payer: BC Managed Care – PPO | Admitting: Family Medicine

## 2021-07-27 ENCOUNTER — Ambulatory Visit: Payer: Self-pay

## 2021-07-27 VITALS — BP 158/84 | HR 69 | Ht 75.0 in | Wt 199.8 lb

## 2021-07-27 DIAGNOSIS — M25561 Pain in right knee: Secondary | ICD-10-CM | POA: Diagnosis not present

## 2021-07-27 DIAGNOSIS — G8929 Other chronic pain: Secondary | ICD-10-CM

## 2021-07-27 NOTE — Patient Instructions (Addendum)
Thank you for coming in today.   I recommend you obtained a compression sleeve to help with your joint problems. There are many options on the market however I recommend obtaining a FULL KNE Body Helix compression sleeve.  You can find information (including how to appropriate measure yourself for sizing) can be found at www.Body http://www.lambert.com/.  Many of these products are health savings account (HSA) eligible.  You can use the compression sleeve at any time throughout the day but is most important to use while being active as well as for 2 hours post-activity.   It is appropriate to ice following activity with the compression sleeve in place.  OK to resume exercise  Follow-up: as needed

## 2021-07-27 NOTE — Progress Notes (Signed)
   I, Philbert Riser, LAT, ATC acting as a scribe for Clementeen Graham, MD.  Subjective:    CC: R knee pain  HPI: Pt is a 61 y/o male c/o R knee pain that's chronic in nature since 2016. Pt started doing a new workout around Dec and has it intensified w/ increased running his R knee started hurting. Pt locates pain to deep within the R knee w/ a unstable feeling medially. Pt has not had any R knee pain since January and is not in any pain today. Pt is wanted to know if he can resume working out.  R knee swelling: no Mechanical symptoms: yes Aggravates: running Treatments tried: decreased activity  Pertinent review of Systems: No fevers or chills  Relevant historical information: CAD   Objective:    Vitals:   07/27/21 1431  BP: (!) 158/84  Pulse: 69  SpO2: 97%   General: Well Developed, well nourished, and in no acute distress.   MSK: Right knee: Normal. Mildly tender palpation medial joint line. Normal motion. Stable ligamentous exam. Mildly positive medial McMurray's test. Intact strength.  Lab and Radiology Results  Diagnostic Limited MSK Ultrasound of: Right knee Quad tendon and patellar tendon normal. Medial joint line mild degenerative. Lateral joint line normal. Posterior knee tiny Baker's cyst. Impression: Mild DJD  X-ray images right knee obtained at PCP office in February provided by patient on a CD personally and independently interpreted Mild medial compartment DJD.  Mild patellofemoral DJD present.  Impression and Recommendations:    Assessment and Plan: 61 y.o. male with right knee pain.  Patient does not currently have pain but will experience pain when he increase his activity level.  He relies on exercise as part of his regular health and has not been able to exercise normally because of knee pain.  Pain thought to be due to either DJD or perhaps a degenerative meniscus tear.  We discussed options.  Since he had pain earlier this year he has done a  lot of quad strengthening which in my opinion should actually be really helpful at managing and preventing knee pain.  I think is okay for him to retry his running protocol especially with a compression sleeve.  If this is not successful next step should be physical therapy.  Additionally an alternative exercise for him could be interval training on an exercise bike which should give him a similar cardiovascular workout but be a little easier on his knee.  We also discussed steroid injection which neither 1 of [place] thinks is a good idea right now.  Recheck as needed.  PDMP not reviewed this encounter. Orders Placed This Encounter  Procedures   [place] LIMITED JOINT SPACE STRUCTURES LOW RIGHT(NO LINKED CHARGES)    Order Specific Question:   Reason for Exam (SYMPTOM  OR DIAGNOSIS REQUIRED)    Answer:   right knee pain    Order Specific Question:   Preferred imaging location?    Answer:   [facility]   No orders of the defined types were placed in this encounter.   Discussed warning signs or symptoms. Please see discharge instructions. Patient expresses understanding.   The above documentation has been reviewed and is accurate and complete Clementeen Graham, M.D.

## 2021-08-08 ENCOUNTER — Encounter: Payer: Self-pay | Admitting: Pharmacist

## 2021-08-08 ENCOUNTER — Encounter: Payer: Self-pay | Admitting: Internal Medicine

## 2021-08-15 MED ORDER — REPATHA SURECLICK 140 MG/ML ~~LOC~~ SOAJ
1.0000 | SUBCUTANEOUS | 11 refills | Status: DC
Start: 2021-08-15 — End: 2021-11-28

## 2021-08-15 NOTE — Addendum Note (Signed)
Addended by: Malena Peer D on: 08/15/2021 03:33 PM   Modules accepted: Orders

## 2021-11-16 ENCOUNTER — Encounter: Payer: Self-pay | Admitting: Internal Medicine

## 2021-11-16 ENCOUNTER — Ambulatory Visit: Payer: BC Managed Care – PPO | Attending: Internal Medicine | Admitting: Internal Medicine

## 2021-11-16 VITALS — BP 130/60 | HR 78 | Ht 75.0 in | Wt 205.4 lb

## 2021-11-16 DIAGNOSIS — Z7189 Other specified counseling: Secondary | ICD-10-CM | POA: Diagnosis not present

## 2021-11-16 DIAGNOSIS — I2510 Atherosclerotic heart disease of native coronary artery without angina pectoris: Secondary | ICD-10-CM

## 2021-11-16 DIAGNOSIS — I2584 Coronary atherosclerosis due to calcified coronary lesion: Secondary | ICD-10-CM

## 2021-11-16 DIAGNOSIS — E7800 Pure hypercholesterolemia, unspecified: Secondary | ICD-10-CM | POA: Diagnosis not present

## 2021-11-16 DIAGNOSIS — G4733 Obstructive sleep apnea (adult) (pediatric): Secondary | ICD-10-CM | POA: Diagnosis not present

## 2021-11-16 NOTE — Patient Instructions (Addendum)
Medication Instructions:  No Changes In Medications at this time.  *If you need a refill on your cardiac medications before your next appointment, please call your pharmacy*  Follow-Up: At [facility], you and your health needs are our priority.  As part of our continuing mission to provide you with exceptional heart care, we have created designated Provider Care Teams.  These Care Teams include your primary Cardiologist (physician) and Advanced Practice Providers (APPs -  Physician Assistants and Nurse Practitioners) who all work together to provide you with the care you need, when you need it.  Your next appointment:   1 year(s)  The format for your next appointment:   In Person  Provider:   Gayatri A Acharya, MD          

## 2021-11-16 NOTE — Progress Notes (Signed)
Cardiology Office Note:    Date:  11/16/2021   ID:  Guy Sanchez, DOB 10/16/1960, MRN [id number]  PCP:  Merri Brunette, MD  Cardiologist:  Parke Poisson, MD  Electrophysiologist:  None   Referring MD: Merri Brunette, MD   Chief Complaint/Reason for Referral: Coronary artery calcifications  History of Present Illness:    Guy Sanchez is a 61 y.o. male with a history of coronary artery calcifications, asthma, and depression, here for follow-up. He was initially seen 09/29/2020 for initial evaluation of coronary artery calcifications and risk stratification.  Doing well overall. The patient denies chest pain, chest pressure, dyspnea at rest or with exertion, palpitations, PND, orthopnea, or leg swelling. Denies cough, fever, chills. Denies nausea, vomiting. Denies syncope or presyncope. Denies dizziness or lightheadedness. Trying to stay active but notes he has gained some weight over the last year. We reviewed dietary and exercise recommendations, in the absence of chest pain moderate or more aerobic exercise is recommended with a mix of strength training and stress management.   Past Medical History:  Diagnosis Date   Asthma    Depression    PONV (postoperative nausea and vomiting)     Past Surgical History:  Procedure Laterality Date   COLONOSCOPY  01/09/2012   Procedure: COLONOSCOPY;  Surgeon: Malissa Hippo, MD;  Location[facility];  Service: Endoscopy;  Laterality: N/A;  11:15   SINUS SURGERY WITH INSTATRAK      Current Medications: Current Meds  Medication Sig   aspirin EC 81 MG tablet Take 81 mg by mouth daily. Swallow whole.   ezetimibe (ZETIA) 10 MG tablet Take 10 mg by mouth daily.   Multiple Vitamin (MULTIVITAMIN) capsule Take 1 capsule by mouth daily.   sildenafil (REVATIO) 20 MG tablet Take 20 mg by mouth as needed.   SODIUM CHLORIDE, EXTERNAL, (SALJET) 0.9 % SOLN Apply topically.   [DISCONTINUED] Evolocumab (REPATHA SURECLICK) 140 MG/ML SOAJ Inject 1 pen   into the skin every 14 (fourteen) days.   [DISCONTINUED] fexofenadine (ALLEGRA) 180 MG tablet Take 180 mg by mouth as needed.   [DISCONTINUED] fluticasone (FLONASE) 50 MCG/ACT nasal spray Place 1 spray into both nostrils as needed.     Allergies:   Crestor [rosuvastatin calcium]   Social History   Tobacco Use   Smoking status: Former    Years: 0.50    Types: Cigarettes    Quit date: 1993    Years since quitting: 30.7   Smokeless tobacco: Never  Substance Use Topics   Alcohol use: Yes    Alcohol/week: 1.0 - 2.0 standard drink of alcohol    Types: 1 - 2 Standard drinks or equivalent per week    Comment: social drinker   Drug use: No    Comment: marijuana in past     Family History: The patient's family history includes Bone cancer in his paternal aunt; Esophageal cancer in his paternal uncle; Heart attack in his maternal grandfather; Sleep apnea in his sister; Stroke in his paternal grandfather.  ROS:   Please see the history of present illness.    (+) Minor chest tightness with heavy exertion All other systems reviewed and are negative.  EKGs/Labs/Other Studies Reviewed:    The following studies were reviewed today:  CT Cardiac Scoring 05/11/2020: IMPRESSION: 1. Coronary artery calcium score of 117.6. This places the patient in the 75th percentile for age, gender and race/ethnicity. 2. Lobulated low-attenuation structure in the dome of the liver measuring 3.0 x 2.8 cm shows low attenuation  but is incompletely imaged. No comparison imaging is available. This may represent a lobulated cyst or hemangioma but is not imaged in its entirety on the current exam. Consider initial evaluation with abdominal sonogram for further assessment.  EKG: 11/16/21: NSR 12/16/2020: EKG was not ordered today. 09/29/2020: Sinus rhythm, possible left atrial enlargement  I have independently reviewed the images from CT cor cal, 05/11/20. Cor cal noted in prox, mid and distal RCA, and in distal  LM/proximal and mid LAD.   Recent Labs: 06/13/2021: ALT 16; BUN 12; Creatinine, Ser 0.80; Hemoglobin 16.0; Platelets 324; Potassium 4.3; Sodium 139   Recent Lipid Panel    Component Value Date/Time   CHOL 98 (L) 03/08/2021 0823   TRIG 59 03/08/2021 0823   HDL 42 03/08/2021 0823   CHOLHDL 2.3 03/08/2021 0823   LDLCALC 42 03/08/2021 0823    Physical Exam:    VS:  BP 130/60   Pulse 78   Ht 6\' 3"  (1.905 m)   Wt 205 lb 6.4 oz (93.2 kg)   SpO2 97%   BMI 25.67 kg/m     Wt Readings from Last 5 Encounters:  11/16/21 205 lb 6.4 oz (93.2 kg)  07/27/21 199 lb 12.8 oz (90.6 kg)  01/06/21 195 lb (88.5 kg)  12/16/20 195 lb (88.5 kg)  10/21/20 195 lb 12.8 oz (88.8 kg)    Constitutional: No acute distress Eyes: sclera non-icteric, normal conjunctiva and lids ENMT: normal dentition, moist mucous membranes Cardiovascular: regular rhythm, normal rate, no murmurs. S1 and S2 normal. Radial pulses normal bilaterally. No jugular venous distention.  Respiratory: clear to auscultation bilaterally GI : normal bowel sounds, soft and nontender. No distention.   MSK: extremities warm, well perfused. No edema.  NEURO: grossly nonfocal exam, moves all extremities. PSYCH: alert and oriented x 3, normal mood and affect.   ASSESSMENT:    1. Hypercholesterolemia      PLAN:    Coronary artery calcification - Plan: EKG 12-Lead Pure hypercholesterolemia - Intermediate risk calcium score in the setting of hyperlipidemia. Goal LDL <70. Agree with medication therapy, did not tolerate statin. Started praluent, tolerating well.  Continues on Zetia 10 mg daily. - continue ASA 81 mg daily. We dicussed class II recommendation from guidelines. - recommend Mediterranean diet. - labs excellent, continue praluent.  Cardiac risk counseling Exercise recommendations: Goal of exercising for at least 30 minutes a day, at least 5 times per week.  Please exercise to a moderate exertion.  This means that while  exercising it is difficult to speak in full sentences, however you are not so short of breath that you feel you must stop, and not so comfortable that you can carry on a full conversation.  Exertion level should be approximately a 5/10, if 10 is the most exertion you can perform.  Diet recommendations: Recommend a heart healthy diet such as the Mediterranean diet.  This diet consists of plant based foods, healthy fats, lean meats, olive oil.  It suggests limiting the intake of simple carbohydrates such as white breads, pastries, and pastas.  It also limits the amount of red meat, wine, and dairy products such as cheese that one should consume on a daily basis.  OSA (obstructive sleep apnea) - tolerating therapy and feeling well. Continue.   Total time of encounter: 30 minutes total time of encounter, including 20 minutes spent in face-to-face patient care on the date of this encounter. This time includes coordination of care and counseling regarding above mentioned problem list. Remainder  of non-face-to-face time involved reviewing chart documents/testing relevant to the patient encounter and documentation in the medical record. I have independently reviewed documentation from referring provider.    Weston Brass, MD, [facility]  [facility]   Medication Adjustments/Labs and Tests Ordered: Current medicines are reviewed at length with the patient today.  Concerns regarding medicines are outlined above.   Orders Placed This Encounter  Procedures   EKG 12-Lead     No orders of the defined types were placed in this encounter.    Patient Instructions  Medication Instructions:  No Changes In Medications at this time. *If you need a refill on your cardiac medications before your next appointment, please call your pharmacy*  Follow-Up: At [facility], you and your health needs are our priority.  As part of our continuing mission to provide you with exceptional heart  care, we have created designated Provider Care Teams.  These Care Teams include your primary Cardiologist (physician) and Advanced Practice Providers (APPs -  Physician Assistants and Nurse Practitioners) who all work together to provide you with the care you need, when you need it.  Your next appointment:   1 year(s)  The format for your next appointment:   In Person  Provider:   Parke Poisson, MD

## 2021-11-24 ENCOUNTER — Encounter: Payer: Self-pay | Admitting: Internal Medicine

## 2021-11-28 ENCOUNTER — Other Ambulatory Visit: Payer: Self-pay | Admitting: Pharmacist Clinician (PhC)/ Clinical Pharmacy Specialist

## 2021-11-28 MED ORDER — REPATHA SURECLICK 140 MG/ML ~~LOC~~ SOAJ: 140.0000 mg | SUBCUTANEOUS | 4 refills | Status: DC

## 2021-11-28 NOTE — Telephone Encounter (Signed)
Prior auth sent 9/26

## 2021-12-27 ENCOUNTER — Encounter (INDEPENDENT_AMBULATORY_CARE_PROVIDER_SITE_OTHER): Payer: Self-pay | Admitting: *Deleted

## 2022-01-04 ENCOUNTER — Ambulatory Visit: Payer: BC Managed Care – PPO | Admitting: Neurology

## 2022-01-04 ENCOUNTER — Encounter: Payer: Self-pay | Admitting: Neurology

## 2022-01-04 VITALS — BP 134/65 | HR 58 | Ht 75.0 in | Wt 205.5 lb

## 2022-01-04 DIAGNOSIS — Z9889 Other specified postprocedural states: Secondary | ICD-10-CM | POA: Diagnosis not present

## 2022-01-04 DIAGNOSIS — R490 Dysphonia: Secondary | ICD-10-CM

## 2022-01-04 DIAGNOSIS — G4733 Obstructive sleep apnea (adult) (pediatric): Secondary | ICD-10-CM | POA: Diagnosis not present

## 2022-01-04 NOTE — Patient Instructions (Signed)
KEEPING IT CLEAN: CPAP HYGIENE PROPER UPKEEP OF YOUR CPAP MACHINE CAN HELP ENSURE THE DEVICE FUNCTIONS PROPERLY CPAP CLEANING INSTRUCTIONS Along with proper CPAP cleaning it is recommended that you replace your mask, tubing and filters once very 3 months and more frequently if you are sick.   DAILY CLEANING Do not use moisturizing soaps, bleach, scented oils, chlorine, or alcohol-based solutions to clean your supplies. These solutions may cause irritation to your skin and lungs and may reduce the life of your products. Dawn Dish soap or Comparable works best for daily cleaning.  **If you've been sick, it's smart to wash your mask, tubing, humidifier and filter daily until your cold, flu or virus symptoms are gone. That can help reduce the amount of time you spend under the weather.  Before using your mask -wash your face daily with soap and water to remove excess facial oils. Wipe down your mask (including areas that come in contact with your skin) using a damp towel with soap and warm water. This will remove any oils, dead skin cells, and sweat on the mask that can affect the quality of the seal. Gently rinse with a clean towel and let the mask air-dry out of direct sunlight. You can also use unscented baby wipes or pre-moistened towels designed specifically for cleaning CPAP masks, which are available on-line. DO NOT USE CLOROX OR DISINFECTING WIPES. If your unit has a humidifier, empty any leftover water instead of letting in sit in the unit all day. Refill the humidifier with clean, distilled water right before bedtime for optimal use WEEKLY (OR MORE FREQUENT) CLEANING Your mask and tubing need a full bath at least once a week to keep it free of dust, bacteria, and germs. (During COVID-19 or any other flu/virus we recommend more frequent cleaning) Clean the CPAP tubing, nasal mask, and headgear in a bathroom sink filled with warm water and a few drops of ammonia-free, mild dish detergent. Avoid  using stronger cleaning products, as they may damage the mask or leave harmful residue. Swirl all parts around for about five minutes, rinse well and let air dry during the day. Hang the tubing over the shower rod, on a towel rack or in the laundry room to ensure all the water drips out. The mask and headgear can be air-dried on a towel or hung on a hook or hanger. You should also wipe down your CPAP machine with a damp cloth. Ensure the unit is unplugged. The towel shouldn't be too damp or wet, as water could get into the machine. Clean the filter by removing it and rinsing it in warm tap water. Run it under the water and squeeze to make sure there is no dust. Then blot down the filter with a towel. Do not wash your machine's white filter, if one is present--those are disposable and should be replaced every two weeks. If you are recovering from being sick, we recommend changing the filter sooner. If your CPAP has a humidifier, that also needs to be cleaned weekly. Empty any remaining water and then wash the water chamber in the sink with warm soapy water. Rinse well and drain out as much of the water as possible. Let the chamber air-dry before placing it back into the CPAP unit. Every other week you should disinfect the humidifier. Do that by soaking it in a solution of one-part vinegar to five parts water for 30 minutes, thoroughly rinsing and then placing in your dishwasher's top rack for washing. And keep   it clean by using only distilled water to prevent mineral deposits that can build up and cause damage to your machine. IMPORTANT TIPS Make caring for your CPAP equipment part of your morning routine. Keep machine and accessories out of direct sunlight to avoid damaging them. Never use bleach to clean accessories. Place machine on a level surface and away from curtains that may interfere with the air intake. Keep track of when you should order replacement parts for your mask and accessories so that  you always get the most out of your CPAP.  

## 2022-01-04 NOTE — Progress Notes (Signed)
SLEEP MEDICINE CLINIC    Provider:  Melvyn Novas, MD  Primary Care Physician:  Merri Brunette, MD [address] [place] [address] [place] [address]     Referring Provider: Merri Brunette, Md [address] [place],  [place] [address]          Chief Complaint according to patient   Patient presents with:     New Patient (Initial Visit) Pt alone, rm 10. Marland Kitchen Here for compliance - I breeze machine CPAP follow up.              HISTORY OF PRESENT ILLNESS:  Guy Sanchez is a 61 year old  Caucasian male patient seen in a RV 01/04/2022. 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  He received an autotitration CPAP machine and had a follow-up visit on CPAP in November of last year.  He is here for his yearly revisit and shows excellent compliance his I breeze machine was used 30 out of 30 days and 29 of those days over 4 consecutive hours.  The average user time is 6.2 hours/day.  The pressure range is still between 7 and 15 cm of water pressure in the 95th percentile pressure at night is 9 cm water air leak is mild 95th percentile for air leakage is 14 L/min, the residual AHI or apnea hypopnea index, is 2.1/h and there are no central apneas arising the residual events are all obstructive in origin.  We had excellent compliance looking at the leak data I saw that the week between the 19th and 27 October had the highest air leakage - but there was a mask change at the end of the month and now is back to normal.   He feels much better when he sleep on CPAP, and he reports better quality sleep.  Nocturia still 1 times at night, only 2 times a week. Snoring. His BMI is lower than at the time of the sleep test. He is training for a 5 k before christmas.      The patient underwent a HST , home sleep test was dated June 01, 2020 and the patient was diagnosed with very mild apnea in REM sleep  however the apnea index doubled from 6.1/h to 12.9/h and was still considered mild  . The RDI was 18/h  which means that there is loud snoring associated with it.   There was no significant low oxygen levels and there was also a normal heart rate variability.  We suggested an auto CPAP device for which the patient unfortunately had to wait very long he was just set up on 10-21-2020, The home sleep test revealed about 25% REM sleep which is a good proportion.  Now using the auto titration device the patient has been highly compliant he used the machine 29 out of 30 days with over 4 hours on average the usage hours were 6.3 hours per night his settings are 7-15 cmH2O there has been no expiratory pressure relief set.  His 95th percentile pressure at night is 8.9 cmH2O we consider any pressure under 10 cm below.  He does have a moderate air leak 95th percentile air leak is 14 L/min.  His residual AHI is 2.1 and consists of apneas and hypopneas but no central apneas are emerging.  The highest and outlying air leak data performed 10-20 and 10-21 as well as for the 24th and  25 October ; all other days he seems to have had very little air leakage.    He had some difficulties with supplies from [employer], and he received several times wrong supplies even after he gave the correct serial numbers.  His wife still sleeps in a different room, but his snoring is resolved.     RESVENT : FFM -  Epworth  score 2 / 24 points.      Seen on 04-28-2020 upon referral from PCP Dr. Shelia Media for a sleep consultation.  Chief concern according to patient :  less quality sleep for at least 2-3 years, snoring is long standing , sleep fragmentation- his wife sleeps in a different bed. He has snored for at least 20 years.    Guy Sanchez  has a past medical history of Asthma, Depression, and PONV (postoperative nausea and vomiting)..   Sleep relevant medical history: he has sinus surgeries, nasal septal surgery last one in Spring 2021-   Nocturia is non- frequent , it doesn't wake him, had a deviated septum repair.   Family medical /sleep history: brother did sleepwalk, his sister uses CPAP with OSA,  She has depression, anxiety and insomnia.    Social history: Patient is working as Paediatric nurse for [employer] and lives in a household with spouse- Family status is married , with one 31 year -old son, 2 cats.  Tobacco use- quit 30 years ago .  ETOH use ; 2 / week . Caffeine intake in form of Coffee( 1 mug a day, in AM ) . Regular exercise in form of walking daily. Early morning sunlight , goes twice a week to Gym.     Sleep habits are as follows: he comes home before 6. The patient's dinner time is 6.30 PM.  The patient goes to bed at 10 PM and immediately asleep-  continues to sleep for 2-3 hours, he wakes up and it not for bathroom breaks, the first time at 3 AM.  He wakes up  2 times a week as early as 1-2 AM and is awake sometimes for 1 hour and sometimes for 12 minutes. Anxiety - he is well aware of it.  The preferred sleep position is supine , with the support of 1-2 pillows.  Dreams are reportedly more frequent, since CPAP.   His wife reports sleep talking.  6 AM is the usual rise time. The patient wakes often up spontaneously. 5.10-AM . .  He reports not feeling refreshed or restored in AM, with symptoms such as dry mouth( even now, after sinus surgery) , and residual fatigue. Naps are not taken I- he can't fall asleep.    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 The amount of student traffic has fallen and he is worried about his job [place]. .       How likely are you to doze in the following situations: 0 = not likely, 1 = slight chance, 2 = moderate chance, 3 = high chance   Sitting and Reading? Watching Television? Sitting inactive in a public place (theater or meeting)? As a passenger in a car for an  hour without a break? Lying down in the afternoon when circumstances permit? Sitting and talking to someone? Sitting quietly after lunch without alcohol? In a car, while stopped for a few minutes in traffic?   Total = 2/ 24  from  4/ 24 points   FSS endorsed at 12/ 63  from 36/ 63 points pre CPAP . He feels so much better and less fatigued on CPAP>   Social History   Socioeconomic History   Marital status: Married    Spouse name: Not on file   Number of children: Not on file   Years of education: Not on file   Highest education level: Not on file  Occupational History   Not on file  Tobacco Use   Smoking status: Former    Years: 0.50    Types: Cigarettes    Quit date: 32    Years since quitting: 30.8   Smokeless tobacco: Never  Substance and Sexual Activity   Alcohol use: Yes    Alcohol/week: 1.0 - 2.0 standard drink of alcohol    Types: 1 - 2 Standard drinks or equivalent per week    Comment: social drinker   Drug use: No    Comment: marijuana in past   Sexual activity: Not on file  Other Topics Concern   Not on file  Social History Narrative   Not on file   Social Determinants of Health   Financial Resource Strain: Not on file  Food Insecurity: Not on file  Transportation Needs: Not on file  Physical Activity: Not on file  Stress: Not on file  Social Connections: Not on file    Family History  Problem Relation Age of Onset   Sleep apnea Sister    Bone cancer Paternal Aunt    Esophageal cancer Paternal Uncle    Heart attack Maternal Grandfather    Stroke Paternal Grandfather     Past Medical History:  Diagnosis Date   Asthma    Depression    PONV (postoperative nausea and vomiting)      Past Surgical History:  Procedure Laterality Date   COLONOSCOPY  01/09/2012   Procedure: COLONOSCOPY;  Surgeon: Malissa Hippo, MD;  [facility];  Service: Endoscopy;  Laterality: N/A;  11:15   SINUS SURGERY WITH INSTATRAK       Current Outpatient Medications on File Prior to Visit  Medication Sig Dispense Refill   aspirin EC 81 MG tablet Take 81 mg by mouth daily. Swallow whole.     Evolocumab (REPATHA SURECLICK) 140 MG/ML SOAJ Inject 140 mg into the skin every 14 (fourteen) days. 6 mL 4   ezetimibe (ZETIA) 10 MG tablet Take 10 mg by mouth daily.     Multiple Vitamin (MULTIVITAMIN) capsule Take 1 capsule by mouth daily.     sildenafil (REVATIO) 20 MG tablet Take 20 mg by mouth as needed.     SODIUM CHLORIDE, EXTERNAL, (SALJET) 0.9 % SOLN Apply topically.     No current facility-administered medications on file prior to visit.    Allergies  Allergen Reactions   Crestor [Rosuvastatin Calcium]     Elevated LFTs    Physical exam:  Today's Vitals   01/04/22 1437  BP: 134/65  Pulse: (!) 58  Weight: 205 lb 8 oz (93.2 kg)  Height: 6\' 3"  (1.905 m)   Body mass index is 25.69 kg/m.   Wt Readings from Last 3 Encounters:  01/04/22 205 lb 8 oz (93.2 kg)  11/16/21 205 lb 6.4  oz (93.2 kg)  07/27/21 199 lb 12.8 oz (90.6 kg)     Ht Readings from Last 3 Encounters:  01/04/22 6\' 3"  (1.905 m)  11/16/21 6\' 3"  (1.905 m)  07/27/21 6\' 3"  (1.905 m)      General: The patient is awake, alert and appears not in acute distress. The patient is well groomed. Head: Normocephalic, atraumatic. Neck is supple. Mallampati 1,  neck circumference:17 inches . Nasal airflow is now  patent.  Retrognathia is not seen. Long face- small open.  Dental status: intact  Cardiovascular:  Regular rate and cardiac rhythm by pulse,  without distended neck veins. Respiratory: Lungs are clear to auscultation.  Skin:  Without evidence of ankle edema, or rash. Trunk: The patient's posture is  erect.   Neurologic exam : The patient is awake and alert, oriented to place and time.   Memory subjective described as intact.  Attention span & concentration ability appears normal.  Speech is fluent,  without  dysarthria, dysphonia or aphasia.  Mood and affect are appropriate.   Cranial nerves: no loss of smell or taste reported  Pupils are equal and briskly reactive to light.  Funduscopic exam deferred.   Extraocular movements in vertical and horizontal planes were intact and without nystagmus. No Diplopia. Visual fields by finger perimetry are intact. Hearing was intact to soft voice and finger rubbing.    Facial sensation intact to fine touch.  Facial motor strength is symmetric and tongue and uvula move midline.  Neck ROM : rotation, tilt and flexion extension were normal for age and shoulder shrug was symmetrical.    Motor exam:  Symmetric bulk, tone and ROM.  He has overcome a restricted shoulder movement on the  left.   Normal tone without cog-wheeling, symmetric grip strength .     After spending a total time of  22  minutes face to face and time for physical and neurologic examination, review of laboratory studies,  personal review of imaging studies, reports and results of other testing and review of referral information / records as far as provided in visit, I have established the following assessments:  1)  OSA _ Mr. Levi has very mild apnea, REM dependent-and CPAP cut down to a residual AHI of 2.1/h. he is much less fatigued, and he feels again refreshed in AM, snoring has resolved.  He had developed a pattern of fragmented sleep and is now sleeping through the night.     My Plan is to proceed with: DME : please advise about power sources for camping trip on CPAP.  RV in 12 months with Np. .    I would like to thank , Md [address] [place],  Merri Brunette [address] for allowing me to meet with and to take care of this pleasant patient.      Electronically signed by: [place], MD 01/04/2022 3:17 PM  [facility] and [id number] Board certified by The Melvyn Novas of Sleep Medicine and Diplomate of the 13/04/2021 of Sleep Medicine. Board certified In Neurology through the ABPN, Fellow of the [employer] of Neurology. Medical Director of [employer].

## 2022-07-02 ENCOUNTER — Ambulatory Visit (AMBULATORY_SURGERY_CENTER): Payer: BC Managed Care – PPO | Admitting: *Deleted

## 2022-07-02 ENCOUNTER — Encounter: Payer: Self-pay | Admitting: Gastroenterology

## 2022-07-02 VITALS — Ht 75.5 in | Wt 205.0 lb

## 2022-07-02 DIAGNOSIS — Z1211 Encounter for screening for malignant neoplasm of colon: Secondary | ICD-10-CM

## 2022-07-02 MED ORDER — NA SULFATE-K SULFATE-MG SULF 17.5-3.13-1.6 GM/177ML PO SOLN: 1.0000 | Freq: Once | ORAL | 0 refills | Status: AC

## 2022-07-02 NOTE — Progress Notes (Signed)
Pt's name and DOB verified at the beginning of the pre-visit.  Pt denies any issues with ambulating, sitting laying down and turning side to side No egg or soy allergy known to patient  No issues known to pt with past sedation with any surgeries or procedures Patient denies ever being intubated Pt has no issues moving head neck or swallowing No FH of Malignant Hyperthermia Pt is not on diet pills Pt is not on home 02  Pt is not on blood thinners  Pt denies issues with constipation  Pt is not on dialysis Pt denies any upcoming cardiac testing NextT ov Sept that's an annual visit Pt encouraged to use to use Singlecare or Goodrx to reduce cost  Patient's chart reviewed by Guy Sanchez CNRA prior to pre-visit and patient appropriate for the [facility].  Pre-visit completed and red dot placed by patient's name on their procedure day (on provider's schedule).  . Visit by phone Pt states weight is 205 lb Instructed pt why it is important to and  to call if they have any changes in health or new medications. Directed them to the # given and on instructions.   Pt states they will.  Instructions reviewed with pt and pt states understanding. Instructed to review again prior to procedure. Pt states they will.  Instructions sent by mail with coupon and by my chart

## 2022-07-27 ENCOUNTER — Ambulatory Visit (AMBULATORY_SURGERY_CENTER): Payer: BC Managed Care – PPO | Admitting: Gastroenterology

## 2022-07-27 ENCOUNTER — Encounter: Payer: Self-pay | Admitting: Gastroenterology

## 2022-07-27 VITALS — BP 111/46 | HR 65 | Temp 98.4°F | Resp 13 | Ht 75.5 in | Wt 205.0 lb

## 2022-07-27 DIAGNOSIS — Z1211 Encounter for screening for malignant neoplasm of colon: Secondary | ICD-10-CM

## 2022-07-27 HISTORY — PX: COLONOSCOPY: SHX174

## 2022-07-27 MED ORDER — SODIUM CHLORIDE 0.9 % IV SOLN: 500.0000 mL | Freq: Once | INTRAVENOUS | Status: DC

## 2022-07-27 NOTE — Patient Instructions (Signed)
Handout on diverticulosis given.    YOU HAD AN ENDOSCOPIC PROCEDURE TODAY AT THE [facility]:   Refer to the procedure report that was given to you for any specific questions about what was found during the examination.  If the procedure report does not answer your questions, please call your gastroenterologist to clarify.  If you requested that your care partner not be given the details of your procedure findings, then the procedure report has been included in a sealed envelope for you to review at your convenience later.  YOU SHOULD EXPECT: Some feelings of bloating in the abdomen. Passage of more gas than usual.  Walking can help get rid of the air that was put into your GI tract during the procedure and reduce the bloating. If you had a lower endoscopy (such as a colonoscopy or flexible sigmoidoscopy) you may notice spotting of blood in your stool or on the toilet paper. If you underwent a bowel prep for your procedure, you may not have a normal bowel movement for a few days.  Please Note:  You might notice some irritation and congestion in your nose or some drainage.  This is from the oxygen used during your procedure.  There is no need for concern and it should clear up in a day or so.  SYMPTOMS TO REPORT IMMEDIATELY:  Following lower endoscopy (colonoscopy or flexible sigmoidoscopy):  Excessive amounts of blood in the stool  Significant tenderness or worsening of abdominal pains  Swelling of the abdomen that is new, acute  Fever of 100F or higher   For urgent or emergent issues, a gastroenterologist can be reached at any hour by calling [phone]. Do not use MyChart messaging for urgent concerns.    DIET:  We do recommend a small meal at first, but then you may proceed to your regular diet.  Drink plenty of fluids but you should avoid alcoholic beverages for 24 hours.  ACTIVITY:  You should plan to take it easy for the rest of today and you should NOT DRIVE or use  heavy machinery until tomorrow (because of the sedation medicines used during the test).    FOLLOW UP: Our staff will call the number listed on your records the next business day following your procedure.  We will call around 7:15- 8:00 am to check on you and address any questions or concerns that you may have regarding the information given to you following your procedure. If we do not reach you, we will leave a message.     If any biopsies were taken you will be contacted by phone or by letter within the next 1-3 weeks.  Please call us at [phone] if you have not heard about the biopsies in 3 weeks.    SIGNATURES/CONFIDENTIALITY: You and/or your care partner have signed paperwork which will be entered into your electronic medical record.  These signatures attest to the fact that that the information above on your After Visit Summary has been reviewed and is understood.  Full responsibility of the confidentiality of this discharge information lies with you and/or your care-partner.  

## 2022-07-27 NOTE — Progress Notes (Signed)
Report to PACU, RN, vss, BBS= Clear.  

## 2022-07-27 NOTE — Progress Notes (Signed)
[facility] History and Physical   Primary Care Physician:  Merri Brunette, MD   Reason for Procedure:   Colon cancer screening  Plan:    Screening colonoscopy     HPI: Guy Sanchez is a 62 y.o. male undergoing average risk screening colonoscopy.  He has no family history of colon cancer and no chronic GI symptoms. He reportedly had a normal colonoscopy 10 years ago.   Past Medical History:  Diagnosis Date   Asthma    Cataract    Depression    Hyperlipidemia    PONV (postoperative nausea and vomiting)    Sleep apnea     Past Surgical History:  Procedure Laterality Date   COLONOSCOPY  01/09/2012   Procedure: COLONOSCOPY;  Surgeon: Malissa Hippo, MD;  [facility];  Service: Endoscopy;  Laterality: N/A;  11:15   COLONOSCOPY  07/27/2022   SINUS SURGERY WITH INSTATRAK     x2    Prior to Admission medications   Medication Sig Start Date End Date Taking? Authorizing Provider  aspirin EC 81 MG tablet Take 162 mg by mouth daily. Swallow whole. Takes   Yes [provider]  ezetimibe (ZETIA) 10 MG tablet Take 10 mg by mouth daily.   Yes [provider]  Evolocumab (REPATHA SURECLICK) 140 MG/ML SOAJ Inject 140 mg into the skin every 14 (fourteen) days. 11/28/21   Parke Poisson, MD  Multiple Vitamin (MULTIVITAMIN) capsule Take 1 capsule by mouth daily.    [provider]  sildenafil (REVATIO) 20 MG tablet Take 20 mg by mouth as needed. 02/11/18   [provider]    Current Outpatient Medications  Medication Sig Dispense Refill   aspirin EC 81 MG tablet Take 162 mg by mouth daily. Swallow whole. Takes     ezetimibe (ZETIA) 10 MG tablet Take 10 mg by mouth daily.     Evolocumab (REPATHA SURECLICK) 140 MG/ML SOAJ Inject 140 mg into the skin every 14 (fourteen) days. 6 mL 4   Multiple Vitamin (MULTIVITAMIN) capsule Take 1 capsule by mouth daily.     sildenafil (REVATIO) 20 MG tablet Take 20 mg by mouth as needed.      Current Facility-Administered Medications  Medication Dose Route Frequency Provider Last Rate Last Admin   0.9 %  sodium chloride infusion  500 mL Intravenous Once Jenel Lucks, MD        Allergies as of 07/27/2022 - Review Complete 07/27/2022  Allergen Reaction Noted   Crestor [rosuvastatin calcium]  10/21/2020    Family History  Problem Relation Age of Onset   Sleep apnea Sister    Bone cancer Paternal Aunt    Esophageal cancer Paternal Uncle    Heart attack Maternal Grandfather    Stroke Paternal Grandfather    Colon cancer Neg Hx    Colon polyps Neg Hx    Rectal cancer Neg Hx    Stomach cancer Neg Hx     Social History   Socioeconomic History   Marital status: Married    Spouse name: Not on file   Number of children: Not on file   Years of education: Not on file   Highest education level: Not on file  Occupational History   Not on file  Tobacco Use   Smoking status: Former    Years: .5    Types: Cigarettes    Quit date: 1993    Years since quitting: 31.4   Smokeless tobacco: Never  Substance and Sexual Activity  Alcohol use: Yes    Alcohol/week: 1.0 - 2.0 standard drink of alcohol    Types: 1 - 2 Standard drinks or equivalent per week    Comment: social drinker   Drug use: No    Comment: marijuana in past   Sexual activity: Not on file  Other Topics Concern   Not on file  Social History Narrative   Not on file   Social Determinants of Health   Financial Resource Strain: Not on file  Food Insecurity: Not on file  Transportation Needs: Not on file  Physical Activity: Not on file  Stress: Not on file  Social Connections: Not on file  Intimate Partner Violence: Not on file    Review of Systems:  All other review of systems negative except as mentioned in the HPI.  Physical Exam: Vital signs BP (!) 145/73   Pulse (!) 56   Temp 98.4 F (36.9 C) (Temporal)   Resp 11   Ht 6' 3.5" (1.918 m)   Wt 205 lb (93 kg)   SpO2 100%   BMI  25.28 kg/m   General:   Alert,  Well-developed, well-nourished, pleasant and cooperative in NAD Airway:  Mallampati 3 Lungs:  Clear throughout to auscultation.   Heart:  Regular rate and rhythm; no murmurs, clicks, rubs,  or gallops. Abdomen:  Soft, nontender and nondistended. Normal bowel sounds.   Neuro/Psych:  Normal mood and affect. A and O x 3   Song Myre E. Tomasa Rand, MD [facility]

## 2022-07-27 NOTE — Op Note (Signed)
[facility] Patient Name: Unknown Manganelli Procedure Date: 07/27/2022 10:43 AM MRN: [id number] Endoscopist: Lorin Picket E. Tomasa Rand , MD, [id number] Age: 62 Referring MD:  Date of Birth: 12-Nov-1960 Gender: Male Account #: [id number] Procedure:                Colonoscopy Indications:              Screening for colorectal malignant neoplasm (last                            colonoscopy was 10 years ago) Medicines:                Monitored Anesthesia Care Procedure:                Pre-Anesthesia Assessment:                           - Prior to the procedure, a History and Physical                            was performed, and patient medications and                            allergies were reviewed. The patient's tolerance of                            previous anesthesia was also reviewed. The risks                            and benefits of the procedure and the sedation                            options and risks were discussed with the patient.                            All questions were answered, and informed consent                            was obtained. Prior Anticoagulants: The patient has                            taken no anticoagulant or antiplatelet agents. ASA                            Grade Assessment: II - A patient with mild systemic                            disease. After reviewing the risks and benefits,                            the patient was deemed in satisfactory condition to                            undergo the procedure.  After obtaining informed consent, the colonoscope                            was passed under direct vision. Throughout the                            procedure, the patient's blood pressure, pulse, and                            oxygen saturations were monitored continuously. The                            CF [id number] #[id number] was introduced through the anus                            and advanced to the the  cecum, identified by                            appendiceal orifice and ileocecal valve. The                            colonoscopy was performed without difficulty. The                            patient tolerated the procedure well. The quality                            of the bowel preparation was excellent. The                            ileocecal valve, appendiceal orifice, and rectum                            were photographed. The bowel preparation used was                            SUPREP via split dose instruction. Scope In: 11:01:57 AM Scope Out: 11:14:41 AM Scope Withdrawal Time: 0 hours 8 minutes 16 seconds  Total Procedure Duration: 0 hours 12 minutes 44 seconds  Findings:                 The perianal and digital rectal examinations were                            normal. Pertinent negatives include normal                            sphincter tone and no palpable rectal lesions.                           A single medium-mouthed diverticulum was found in                            the descending colon.  The exam was otherwise normal throughout the                            examined colon.                           The retroflexed view of the distal rectum and anal                            verge was normal and showed no anal or rectal                            abnormalities. Complications:            No immediate complications. Estimated Blood Loss:     Estimated blood loss: none. Impression:               - Diverticulosis in the descending colon.                           - The distal rectum and anal verge are normal on                            retroflexion view.                           - No specimens collected. Recommendation:           - Patient has a contact number available for                            emergencies. The signs and symptoms of potential                            delayed complications were discussed with the                             patient. Return to normal activities tomorrow.                            Written discharge instructions were provided to the                            patient.                           - Resume previous diet.                           - Continue present medications.                           - Repeat colonoscopy in 10 years for screening                            purposes. Jariana Shumard E. Tomasa Rand, MD 07/27/2022 11:18:41 AM This report has been signed electronically.

## 2022-07-27 NOTE — Progress Notes (Signed)
Pt's states no medical or surgical changes since previsit or office visit. 

## 2022-07-31 ENCOUNTER — Telehealth: Payer: Self-pay

## 2022-07-31 NOTE — Telephone Encounter (Signed)
  Follow up Call-     07/27/2022    8:45 AM  Call back number  Post procedure Call Back phone  # [phone]  Permission to leave phone message Yes     Patient questions:  Do you have a fever, pain , or abdominal swelling? No. Pain Score  0 *  Have you tolerated food without any problems? Yes.    Have you been able to return to your normal activities? Yes.    Do you have any questions about your discharge instructions: Diet   No. Medications  No. Follow up visit  No.  Do you have questions or concerns about your Care? No.  Actions: * If pain score is 4 or above: No action needed, pain <4.

## 2022-11-13 ENCOUNTER — Encounter: Payer: Self-pay | Admitting: Internal Medicine

## 2022-11-13 ENCOUNTER — Encounter: Payer: Self-pay | Admitting: Neurology

## 2022-11-13 ENCOUNTER — Ambulatory Visit: Payer: BC Managed Care – PPO | Attending: Internal Medicine | Admitting: Internal Medicine

## 2022-11-13 VITALS — BP 130/66 | HR 73 | Ht 75.5 in | Wt 208.8 lb

## 2022-11-13 DIAGNOSIS — I2510 Atherosclerotic heart disease of native coronary artery without angina pectoris: Secondary | ICD-10-CM | POA: Diagnosis not present

## 2022-11-13 NOTE — Progress Notes (Signed)
Cardiology Office Note:    Date:  11/13/2022   ID:  [name], DOB 03-Sep-1960, MRN [id number]  PCP:  Merri Brunette, MD  Cardiologist:  Parke Poisson, MD  Electrophysiologist:  None   Referring MD: Merri Brunette, MD   Chief Complaint/Reason for Referral: Coronary artery calcifications  History of Present Illness:    Guy Sanchez is a 62 y.o. male with a history of coronary artery calcifications, asthma, and depression, here for follow-up. He was initially seen 09/29/2020 for initial evaluation of coronary artery calcifications and risk stratification.  Doing well without chest pain or shortness of breath.  He has been attempting to exercise with strength training a couple of times a week, attempting to run as often as his knee will allow him, and trying to walk as often as possible as well.  Rides a bike on weekends for long distances with his wife and feels well.    Past Medical History:  Diagnosis Date   Asthma    Cataract    Depression    Hyperlipidemia    PONV (postoperative nausea and vomiting)    Sleep apnea     Past Surgical History:  Procedure Laterality Date   COLONOSCOPY  01/09/2012   Procedure: COLONOSCOPY;  Surgeon: Malissa Hippo, MD;  [facility];  Service: Endoscopy;  Laterality: N/A;  11:15   COLONOSCOPY  07/27/2022   SINUS SURGERY WITH INSTATRAK     x2    Current Medications: Current Meds  Medication Sig   aspirin EC 81 MG tablet Take 162 mg by mouth daily. Swallow whole. Takes   Evolocumab (REPATHA SURECLICK) 140 MG/ML SOAJ Inject 140 mg into the skin every 14 (fourteen) days.   ezetimibe (ZETIA) 10 MG tablet Take 10 mg by mouth daily.   Multiple Vitamin (MULTIVITAMIN) capsule Take 1 capsule by mouth daily.   sildenafil (REVATIO) 20 MG tablet Take 20 mg by mouth as needed.     Allergies:   Crestor [rosuvastatin calcium]   Social History   Tobacco Use   Smoking status: Former    Current packs/day: 0.00    Types: Cigarettes     Start date: 09/1990    Quit date: 1993    Years since quitting: 31.7   Smokeless tobacco: Never  Substance Use Topics   Alcohol use: Yes    Alcohol/week: 1.0 - 2.0 standard drink of alcohol    Types: 1 - 2 Standard drinks or equivalent per week    Comment: social drinker   Drug use: No    Comment: marijuana in past     Family History: The patient's family history includes Bone cancer in his paternal aunt; Esophageal cancer in his paternal uncle; Heart attack in his maternal grandfather; Sleep apnea in his sister; Stroke in his paternal grandfather. There is no history of Colon cancer, Colon polyps, Rectal cancer, or Stomach cancer.  ROS:   Please see the history of present illness.     All other systems reviewed and are negative.  EKGs/Labs/Other Studies Reviewed:    The following studies were reviewed today:  CT Cardiac Scoring 05/11/2020: IMPRESSION: 1. Coronary artery calcium score of 117.6. This places the patient in the 75th percentile for age, gender and race/ethnicity. 2. Lobulated low-attenuation structure in the dome of the liver measuring 3.0 x 2.8 cm shows low attenuation but is incompletely imaged. No comparison imaging is available. This may represent a lobulated cyst or hemangioma but is not imaged in its entirety on the  current exam. Consider initial evaluation with abdominal sonogram for further assessment.  EKG: EKG Interpretation Date/Time:  Tuesday November 13 2022 08:16:38 EDT Ventricular Rate:  61 PR Interval:  166 QRS Duration:  78 QT Interval:  364 QTC Calculation: 366 R Axis:   85  Text Interpretation: Normal sinus rhythm Normal ECG When compared with ECG of 13-Jun-2021 18:19, No significant change was found Confirmed by Weston Brass ([id number]) on 11/13/2022 8:22:51 AM   11/16/21: NSR 12/16/2020: EKG was not ordered today. 09/29/2020: Sinus rhythm, possible left atrial enlargement  I have independently reviewed the images from CT cor cal,  05/11/20. Cor cal noted in prox, mid and distal RCA, and in distal LM/proximal and mid LAD.   Recent Labs: No results found for requested labs within last 365 days.   Recent Lipid Panel    Component Value Date/Time   CHOL 98 (L) 03/08/2021 0823   TRIG 59 03/08/2021 0823   HDL 42 03/08/2021 0823   CHOLHDL 2.3 03/08/2021 0823   LDLCALC 42 03/08/2021 0823    Physical Exam:    VS:  BP 130/66 (BP Location: Left Arm, Patient Position: Sitting, Cuff Size: Normal)   Pulse 73   Ht 6' 3.5" (1.918 m)   Wt 208 lb 12.8 oz (94.7 kg)   SpO2 96%   BMI 25.75 kg/m     Wt Readings from Last 5 Encounters:  11/13/22 208 lb 12.8 oz (94.7 kg)  07/27/22 205 lb (93 kg)  07/02/22 205 lb (93 kg)  01/04/22 205 lb 8 oz (93.2 kg)  11/16/21 205 lb 6.4 oz (93.2 kg)    Constitutional: No acute distress Eyes: sclera non-icteric, normal conjunctiva and lids ENMT: normal dentition, moist mucous membranes Cardiovascular: regular rhythm, normal rate, no murmurs. S1 and S2 normal. Radial pulses normal bilaterally. No jugular venous distention.  Respiratory: clear to auscultation bilaterally GI : normal bowel sounds, soft and nontender. No distention.   MSK: extremities warm, well perfused. No edema.  NEURO: grossly nonfocal exam, moves all extremities. PSYCH: alert and oriented x 3, normal mood and affect.   ASSESSMENT:    1. Atherosclerosis of native coronary artery of native heart without angina pectoris      PLAN:    Coronary artery calcification - Plan: EKG 12-Lead Pure hypercholesterolemia - Intermediate risk calcium score in the setting of hyperlipidemia. Goal LDL <70. Agree with medication therapy, did not tolerate statin. Started praluent, tolerating well.  Continues on Zetia 10 mg daily.  Lipids have been optimized - continue ASA 81 mg daily. We dicussed class II recommendation from guidelines.  He is currently taking aspirin 162 mg daily as directed by his primary care physician and pharmacist  in that office, I will clarify with them if there is a noncardiac indication, otherwise 81 mg daily should suffice. - recommend Mediterranean diet with a focus on lean protein.  Cardiac risk counseling Exercise recommendations: Goal of exercising for at least 30 minutes a day, at least 5 times per week.  Please exercise to a moderate exertion.  This means that while exercising it is difficult to speak in full sentences, however you are not so short of breath that you feel you must stop, and not so comfortable that you can carry on a full conversation.  Exertion level should be approximately a 5/10, if 10 is the most exertion you can perform.  Diet recommendations: Recommend a heart healthy diet such as the Mediterranean diet.  This diet consists of plant based foods, healthy  fats, lean meats, olive oil.  It suggests limiting the intake of simple carbohydrates such as white breads, pastries, and pastas.  It also limits the amount of red meat, wine, and dairy products such as cheese that one should consume on a daily basis.  OSA (obstructive sleep apnea) - tolerating therapy and feeling well.  Sleep has not improved but he has a follow-up with Dr. Vickey Huger upcoming.  Also requesting assistance with obtaining a battery for camping trips with his CPAP.  If he is unsuccessful at obtaining this information we can certainly try to help if needed.   Total time of encounter: 30 minutes total time of encounter, including 20 minutes spent in face-to-face patient care on the date of this encounter. This time includes coordination of care and counseling regarding above mentioned problem list. Remainder of non-face-to-face time involved reviewing chart documents/testing relevant to the patient encounter and documentation in the medical record. I have independently reviewed documentation from referring provider.    Weston Brass, MD, [facility]  [facility]   Medication Adjustments/Labs and Tests  Ordered: Current medicines are reviewed at length with the patient today.  Concerns regarding medicines are outlined above.   Orders Placed This Encounter  Procedures   EKG 12-Lead     No orders of the defined types were placed in this encounter.    There are no Patient Instructions on file for this visit.

## 2022-11-13 NOTE — Patient Instructions (Signed)
Medication Instructions:  Your physician recommends that you continue on your current medications as directed. Please refer to the Current Medication list given to you today.  *If you need a refill on your cardiac medications before your next appointment, please call your pharmacy*   Follow-Up: At [facility], you and your health needs are our priority.  As part of our continuing mission to provide you with exceptional heart care, we have created designated Provider Care Teams.  These Care Teams include your primary Cardiologist (physician) and Advanced Practice Providers (APPs -  Physician Assistants and Nurse Practitioners) who all work together to provide you with the care you need, when you need it.  We recommend signing up for the patient portal called "MyChart".  Sign up information is provided on this After Visit Summary.  MyChart is used to connect with patients for Virtual Visits (Telemedicine).  Patients are able to view lab/test results, encounter notes, upcoming appointments, etc.  Non-urgent messages can be sent to your provider as well.   To learn more about what you can do with MyChart, go to [email or web address].    Your next appointment:   12 month(s)  Provider:   Gayatri A Acharya, MD    

## 2022-11-14 ENCOUNTER — Encounter: Payer: Self-pay | Admitting: Neurology

## 2022-12-16 ENCOUNTER — Other Ambulatory Visit: Payer: Self-pay | Admitting: Internal Medicine

## 2022-12-21 ENCOUNTER — Other Ambulatory Visit: Payer: Self-pay | Admitting: Pharmacist

## 2022-12-21 MED ORDER — REPATHA SURECLICK 140 MG/ML ~~LOC~~ SOAJ: 140.0000 mg | SUBCUTANEOUS | 3 refills | Status: DC

## 2022-12-25 ENCOUNTER — Telehealth: Payer: Self-pay | Admitting: Pharmacy Technician

## 2022-12-25 ENCOUNTER — Other Ambulatory Visit (HOSPITAL_COMMUNITY): Payer: Self-pay

## 2022-12-25 NOTE — Telephone Encounter (Signed)
Pharmacy Patient Advocate Encounter   Received notification from CoverMyMeds that prior authorization for repatha is required/requested.   Insurance verification completed.   The patient is insured through [facility] .   Per test claim: PA required; PA submitted to [employer] [facility] via CoverMyMeds Key/confirmation #/[id number] Status is pending

## 2022-12-25 NOTE — Telephone Encounter (Signed)
Pharmacy Patient Advocate Encounter  Received notification from [facility] that Prior Authorization for repatha has been APPROVED from 12/25/22 to 12/24/23. Ran test claim, Copay is $141.00-3 months. This test claim was processed through [facility]- copay amounts may vary at other pharmacies due to pharmacy/plan contracts, or as the patient moves through the different stages of their insurance plan.   PA #/Case ID/Reference #: [id number]

## 2023-01-01 ENCOUNTER — Encounter: Payer: Self-pay | Admitting: Internal Medicine

## 2023-01-09 ENCOUNTER — Telehealth: Payer: Self-pay

## 2023-01-09 NOTE — Telephone Encounter (Signed)
Phone room: Please call and ask pt if he can bring cpap machine to appt and if he is still using it.  Thanks,  Production assistant, radio

## 2023-01-09 NOTE — Progress Notes (Unsigned)
Patient: Guy Sanchez Date of Birth: 1961/03/01  Reason for Visit: Follow up History from: Patient Primary Neurologist: Dohmeier  ASSESSMENT AND PLAN 62 y.o. year old male   1.  OSA on CPAP  -Has superb compliance with very good subjective benefit. Continue nightly CPAP usage.  Recommend minimum of 4 hours nightly utilization.  Continue current settings.  Will follow-up in 1 year or sooner if needed.  HISTORY OF PRESENT ILLNESS: Today 01/10/23 Doing great on CPAP.  ESS 1.  CPAP download attached shows superb compliance 100% nightly greater than 4 hours.  AHI 1.4.  Leak 2.8.  Goes to bed around 10 PM, usually wakes around 5 AM.  He is very committed to his exercise regimen, walks daily, goes to the gym a few times a week.  Prior to using CPAP he felt terrible, frequently fell asleep at work.  He works as a Comptroller at [employer].  His health is been overall good.  No issues with CPAP or the equipment.  HISTORY  Guy Sanchez is a 62 year old  Caucasian male patient seen in a RV 01/04/2022. 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  He received an autotitration CPAP machine and had a follow-up visit on CPAP in November of last year.  He is here for his yearly revisit and shows excellent compliance his I breeze machine was used 30 out of 30 days and 29 of those days over 4 consecutive hours.  The average user time is 6.2 hours/day.  The pressure range is still between 7 and 15 cm of water pressure in the 95th percentile pressure at night is 9 cm water air leak is mild 95th percentile for air leakage is 14 L/min, the residual AHI or apnea hypopnea index, is 2.1/h and there are no central apneas arising the residual events are all obstructive in origin.  We had excellent compliance looking at the leak data I saw that the week between the 19th and 27 October had the highest air leakage - but there was a mask change at  the end of the month and now is back to normal.    He feels much better when he sleep on CPAP, and he reports better quality sleep.  Nocturia still 1 times at night, only 2 times a week. Snoring. His BMI is lower than at the time of the sleep test. He is training for a 5 k before christmas.    The patient underwent a HST , home sleep test was dated June 01, 2020 and the patient was diagnosed with very mild apnea in REM sleep however the apnea index doubled from 6.1/h to 12.9/h and was still considered mild  . The RDI was 18/h  which means that there is loud snoring associated with it.   There was no significant low oxygen levels and there was also a normal heart rate variability.  We suggested an auto CPAP device for which the patient unfortunately had to wait very long he was just set up on 10-21-2020, The home sleep test revealed about 25% REM sleep which is a good proportion.  Now using the auto titration device the patient has been highly compliant he used the machine 29 out of 30 days with over 4 hours on average the usage hours were 6.3 hours per night his settings are 7-15 cmH2O there has been no expiratory pressure relief set.  His 95th  percentile pressure at night is 8.9 cmH2O we consider any pressure under 10 cm below.  He does have a moderate air leak 95th percentile air leak is 14 L/min.  His residual AHI is 2.1 and consists of apneas and hypopneas but no central apneas are emerging.  The highest and outlying air leak data performed 10-20 and 10-21 as well as for the 24th and 25 October ; all other days he seems to have had very little air leakage.     He had some difficulties with supplies from [employer], and he received several times wrong supplies even after he gave the correct serial numbers.  His wife still sleeps in a different room, but his snoring is resolved.  REVIEW OF SYSTEMS: Out of a complete 14 system review of symptoms, the patient complains only of the following symptoms, and all  other reviewed systems are negative.  See HPI  ALLERGIES: Allergies  Allergen Reactions   Crestor [Rosuvastatin Calcium]     Elevated LFTs    HOME MEDICATIONS: Outpatient Medications Prior to Visit  Medication Sig Dispense Refill   aspirin EC 81 MG tablet Take 162 mg by mouth daily. Swallow whole. Takes     Evolocumab (REPATHA SURECLICK) 140 MG/ML SOAJ Inject 140 mg into the skin every 14 (fourteen) days. 6 mL 3   ezetimibe (ZETIA) 10 MG tablet Take 10 mg by mouth daily.     Multiple Vitamin (MULTIVITAMIN) capsule Take 1 capsule by mouth daily.     sildenafil (REVATIO) 20 MG tablet Take 20 mg by mouth as needed.     No facility-administered medications prior to visit.    PAST MEDICAL HISTORY: Past Medical History:  Diagnosis Date   Asthma    Cataract    Depression    Hyperlipidemia    PONV (postoperative nausea and vomiting)    Sleep apnea     PAST SURGICAL HISTORY: Past Surgical History:  Procedure Laterality Date   COLONOSCOPY  01/09/2012   Procedure: COLONOSCOPY;  Surgeon: Malissa Hippo, MD;  [facility];  Service: Endoscopy;  Laterality: N/A;  11:15   COLONOSCOPY  07/27/2022   SINUS SURGERY WITH INSTATRAK     x2    FAMILY HISTORY: Family History  Problem Relation Age of Onset   Sleep apnea Sister    Bone cancer Paternal Aunt    Esophageal cancer Paternal Uncle    Heart attack Maternal Grandfather    Stroke Paternal Grandfather    Colon cancer Neg Hx    Colon polyps Neg Hx    Rectal cancer Neg Hx    Stomach cancer Neg Hx     SOCIAL HISTORY: Social History   Socioeconomic History   Marital status: Married    Spouse name: Not on file   Number of children: Not on file   Years of education: Not on file   Highest education level: Not on file  Occupational History   Not on file  Tobacco Use   Smoking status: Former    Current packs/day: 0.00    Types: Cigarettes    Start date: 09/1990    Quit date: 1993    Years since quitting:  31.8   Smokeless tobacco: Never  Substance and Sexual Activity   Alcohol use: Yes    Alcohol/week: 1.0 - 2.0 standard drink of alcohol    Types: 1 - 2 Standard drinks or equivalent per week    Comment: social drinker   Drug use: No    Comment:  marijuana in past   Sexual activity: Not on file  Other Topics Concern   Not on file  Social History Narrative   Not on file   Social Determinants of Health   Financial Resource Strain: Not on file  Food Insecurity: Not on file  Transportation Needs: Not on file  Physical Activity: Not on file  Stress: Not on file  Social Connections: Not on file  Intimate Partner Violence: Not on file    PHYSICAL EXAM  Vitals:   01/10/23 1434  BP: 137/74  Pulse: 74  Weight: 208 lb 3.2 oz (94.4 kg)  Height: 6\' 3"  (1.905 m)   Body mass index is 26.02 kg/m.  Generalized: Well developed, in no acute distress  Neurological examination  Mentation: Alert oriented to time, place, history taking. Follows all commands speech and language fluent Lung sounds clear, cardiac rhythm regular. Motor: Moves all extremities independent Gait and station: Gait is normal.   DIAGNOSTIC DATA (LABS, IMAGING, TESTING) - I reviewed patient records, labs, notes, testing and imaging myself where available.  Lab Results  Component Value Date   WBC 10.7 (H) 06/13/2021   HGB 16.0 06/13/2021   HCT 47.0 06/13/2021   MCV 91.3 06/13/2021   PLT 324 06/13/2021      Component Value Date/Time   NA 139 06/13/2021 1826   K 4.3 06/13/2021 1826   CL 100 06/13/2021 1826   CO2 27 06/13/2021 1800   GLUCOSE 93 06/13/2021 1826   BUN 12 06/13/2021 1826   CREATININE 0.80 06/13/2021 1826   CALCIUM 9.0 06/13/2021 1800   PROT 7.4 06/13/2021 1800   ALBUMIN 3.9 06/13/2021 1800   AST 18 06/13/2021 1800   ALT 16 06/13/2021 1800   ALKPHOS 73 06/13/2021 1800   BILITOT 0.7 06/13/2021 1800   GFRNONAA >60 06/13/2021 1800   Lab Results  Component Value Date   CHOL 98 (L)  03/08/2021   HDL 42 03/08/2021   LDLCALC 42 03/08/2021   TRIG 59 03/08/2021   CHOLHDL 2.3 03/08/2021   No results found for: "HGBA1C" No results found for: "VITAMINB12" No results found for: "TSH"  Margie Ege, AGNP-C, DNP 01/10/2023, 3:09 PM [facility] [address] [place], [place] [address] [phone]

## 2023-01-09 NOTE — Telephone Encounter (Signed)
Spoke with pt, confirmed appt and reminded to bring cpap machine, power cord, and mask

## 2023-01-10 ENCOUNTER — Encounter: Payer: Self-pay | Admitting: Neurology

## 2023-01-10 ENCOUNTER — Ambulatory Visit: Payer: BC Managed Care – PPO | Admitting: Neurology

## 2023-01-10 VITALS — BP 137/74 | HR 74 | Ht 75.0 in | Wt 208.2 lb

## 2023-01-10 DIAGNOSIS — G4733 Obstructive sleep apnea (adult) (pediatric): Secondary | ICD-10-CM | POA: Diagnosis not present

## 2023-01-10 NOTE — Patient Instructions (Signed)
Keep up the great work with CPAP! Continue nightly usage for minimum of 4 hours.  We will continue current settings.  Follow-up in 1 year.  Thanks!!

## 2023-04-17 LAB — LAB REPORT - SCANNED: EGFR: 97

## 2023-11-07 ENCOUNTER — Encounter: Payer: Self-pay | Admitting: Internal Medicine

## 2023-11-24 ENCOUNTER — Other Ambulatory Visit: Payer: Self-pay | Admitting: Internal Medicine

## 2023-11-28 ENCOUNTER — Encounter: Payer: Self-pay | Admitting: Internal Medicine

## 2023-11-29 MED ORDER — REPATHA SURECLICK 140 MG/ML ~~LOC~~ SOAJ: 140.0000 mg | SUBCUTANEOUS | 2 refills | Status: DC

## 2023-12-18 IMAGING — CT CT ANGIO HEAD-NECK (W OR W/O PERF)
1 of 11 series · 5 of 33 positions shown · non-contrast
Comparison: None.

CLINICAL DATA: Initial evaluation for acute right-sided Horner
syndrome.

EXAM:
CT ANGIOGRAPHY HEAD AND NECK
TECHNIQUE: Multidetector CT imaging of the head and neck was performed using
the standard protocol during bolus administration of intravenous
contrast. Multiplanar CT image reconstructions and MIPs were
obtained to evaluate the vascular anatomy. Carotid stenosis
measurements (when applicable) are obtained utilizing NASCET
criteria, using the distal internal carotid diameter as the
denominator.

[Series 12: ax thins · axial · 0.39mm/px · z∈[-366,-100]mm · 5 of 401 slices shown]
[im 67/401  soft-tissue]
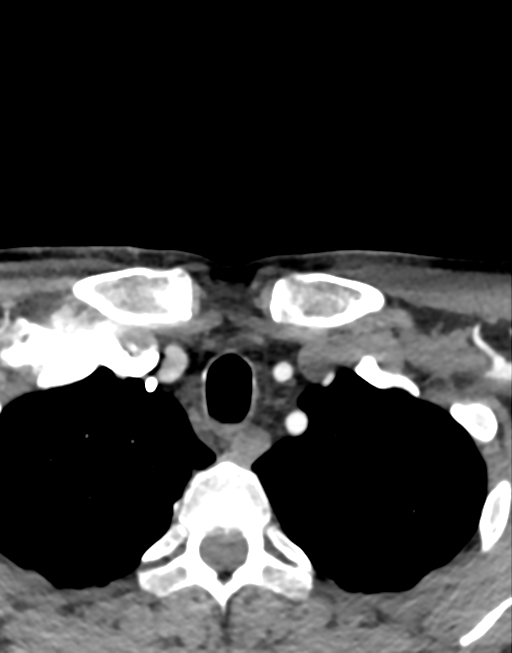
[im 134/401  bone]
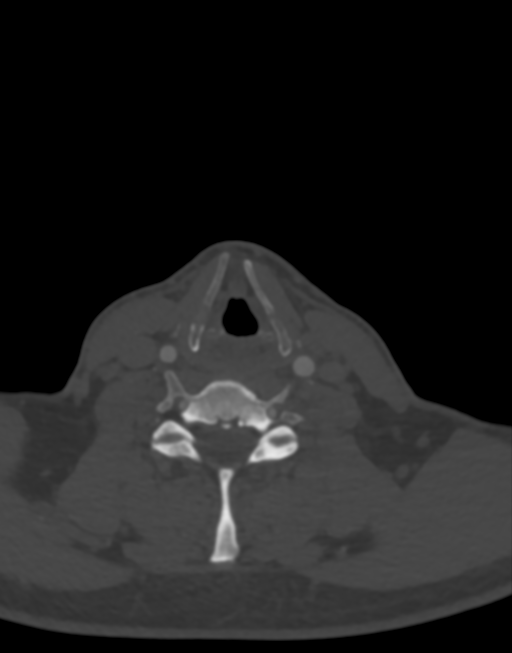
[im 201/401  soft-tissue]
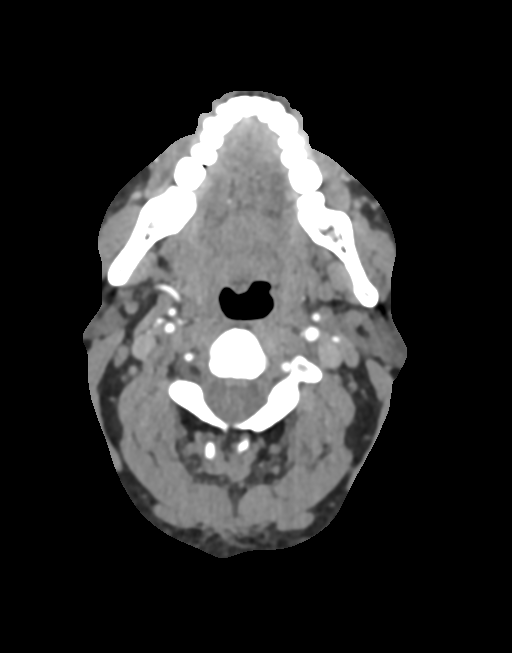
[im 267/401  bone]
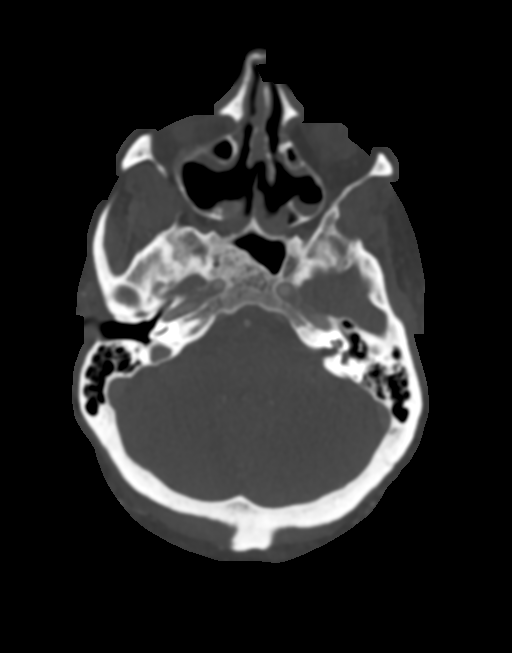
[im 334/401  soft-tissue]
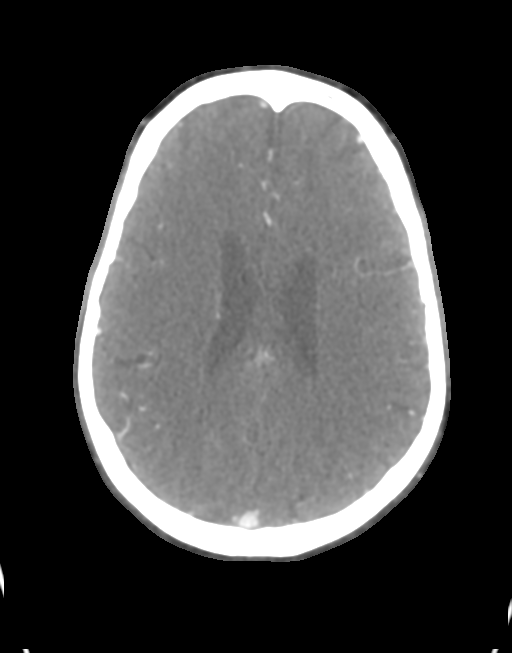

[5 of 33 positions shown; findings below may reference images not displayed]

RADIATION DOSE REDUCTION: This exam was performed according to the
departmental dose-optimization program which includes automated
exposure control, adjustment of the mA and/or kV according to
patient size and/or use of iterative reconstruction technique.

CONTRAST:  75mL OMNIPAQUE IOHEXOL 350 MG/ML SOLN
FINDINGS: CT HEAD FINDINGS

Brain: Cerebral volume within normal limits. No acute intracranial
hemorrhage. No visible acute large vessel territory infarct. No mass
lesion or midline shift. No hydrocephalus or extra-axial fluid
collection.

Vascular: No hyperdense vessel.

Skull: Scalp soft tissues within normal limits.  Calvarium intact.

Sinuses: Moderate mucosal thickening in opacity seen throughout the
frontoethmoidal and maxillary sinuses with sequelae of prior sinus
surgery. No mastoid effusion.

Orbits: Globes orbital soft tissues demonstrate no acute finding.

Review of the MIP images confirms the above findings

CTA NECK FINDINGS

Aortic arch: Visualized aortic arch normal in caliber with normal
branch pattern. No stenosis about the origin of the great vessels.

Right carotid system: Right common and internal carotid arteries
widely patent without stenosis, dissection or occlusion.

Left carotid system: Left common and internal carotid arteries
widely patent without stenosis, dissection or occlusion.

Vertebral arteries: Both vertebral arteries arise from the
subclavian arteries. No proximal subclavian artery stenosis. Both
vertebral arteries widely patent without stenosis, dissection or
occlusion.

Skeleton: No discrete or worrisome osseous lesions.

Other neck: No other acute soft tissue abnormality within the neck.

Upper chest: Visualized upper chest demonstrates no acute finding.

Review of the MIP images confirms the above findings

CTA HEAD FINDINGS

Anterior circulation: Petrous segments patent bilaterally. Mild
atheromatous plaque within the right carotid siphon without
hemodynamically significant stenosis. Left ICA widely patent through
the siphon. Dominant widely patent left A1 segment. Right A1
hypoplastic and/or absent, accounting for the diminutive right ICA
is compared to the left. Normal anterior communicating artery
complex. Anterior cerebral arteries patent without stenosis. Normal
in stenosis or occlusion. Normal MCA bifurcations. Distal MCA
branches perfused and symmetric.

Posterior circulation: Both V4 segments patent to the
vertebrobasilar junction without stenosis. Both PICA origins patent
and normal. Basilar widely patent to its distal aspect without
stenosis. Superior cerebellar arteries patent bilaterally. Both PCAs
primarily supplied via the basilar and are well perfused to there
distal aspects.

Venous sinuses: Patent allowing for timing the contrast bolus.

Anatomic variants: As above.

Review of the MIP images confirms the above findings
IMPRESSION: 1. Negative CTA of the head and neck. No large vessel occlusion or
other emergent finding. No evidence for acute dissection.
2. No other acute intracranial abnormality.
3. Moderate chronic paranasal sinus disease with sequelae of prior
sinus surgery.

## 2024-01-14 ENCOUNTER — Encounter: Payer: Self-pay | Admitting: *Deleted

## 2024-01-16 ENCOUNTER — Encounter: Payer: Self-pay | Admitting: Internal Medicine

## 2024-01-16 ENCOUNTER — Telehealth: Payer: Self-pay | Admitting: Pharmacy Technician

## 2024-01-16 ENCOUNTER — Ambulatory Visit: Payer: Self-pay | Attending: Internal Medicine | Admitting: Internal Medicine

## 2024-01-16 ENCOUNTER — Other Ambulatory Visit (HOSPITAL_COMMUNITY): Payer: Self-pay

## 2024-01-16 VITALS — BP 137/74 | HR 64 | Resp 16 | Ht 75.0 in | Wt 210.2 lb

## 2024-01-16 DIAGNOSIS — Z79899 Other long term (current) drug therapy: Secondary | ICD-10-CM

## 2024-01-16 DIAGNOSIS — E7800 Pure hypercholesterolemia, unspecified: Secondary | ICD-10-CM

## 2024-01-16 DIAGNOSIS — G4733 Obstructive sleep apnea (adult) (pediatric): Secondary | ICD-10-CM

## 2024-01-16 DIAGNOSIS — I2510 Atherosclerotic heart disease of native coronary artery without angina pectoris: Secondary | ICD-10-CM

## 2024-01-16 MED ORDER — REPATHA SURECLICK 140 MG/ML ~~LOC~~ SOAJ: 140.0000 mg | SUBCUTANEOUS | 12 refills | Status: AC

## 2024-01-16 MED ORDER — EZETIMIBE 10 MG PO TABS: 10.0000 mg | ORAL_TABLET | Freq: Every day | ORAL | 3 refills | Status: AC

## 2024-01-16 NOTE — Telephone Encounter (Signed)
 Pharmacy Patient Advocate Encounter   Received notification from CoverMyMeds that prior authorization for Repatha  is required/requested.   Insurance verification completed.   The patient is insured through [facility] ADVANTAGE/RX ADVANCE.   Per test claim: PA required; PA submitted to above mentioned insurance via Latent Key/confirmation #/EOC [id number] Status is pending

## 2024-01-16 NOTE — Telephone Encounter (Signed)
 Pharmacy Patient Advocate Encounter  Received notification from [employer] that Prior Authorization for repatha  has been APPROVED from 01/16/24 to 01/15/25. Ran test claim, Copay is $47.00- one month. This test claim was processed through [facility]- copay amounts may vary at other pharmacies due to pharmacy/plan contracts, or as the patient moves through the different stages of their insurance plan.   PA #/Case ID/Reference #: [id number]

## 2024-01-16 NOTE — Patient Instructions (Signed)
 Medication Instructions:  No Changes *If you need a refill on your cardiac medications before your next appointment, please call your pharmacy*  Lab Work: None  Follow-Up: At [facility], you and your health needs are our priority.  As part of our continuing mission to provide you with exceptional heart care, our providers are all part of one team.  This team includes your primary Cardiologist (physician) and Advanced Practice Providers or APPs (Physician Assistants and Nurse Practitioners) who all work together to provide you with the care you need, when you need it.  Your next appointment:   1 year(s)  Provider:   Soyla DELENA Merck, MD   Other Instructions Dr. Merck has requested that you monitor your blood pressure three times weekly and get in touch with us  if you see any Elevated BP's (>140/90) consistently.   We recommend an Upper Arm OMRON brand cuff  HOW TO TAKE YOUR BLOOD PRESSURE: Rest 5-10 minutes before taking your blood pressure. Don't smoke or drink caffeinated beverages for at least 30 minutes before. Take your blood pressure before (not after) you eat. Ensure your bladder is Empty. Sit comfortably with your back supported and both feet on the floor (don't cross your legs). Elevate your arm to heart level on a table or a desk. Use the proper sized cuff. It should fit smoothly and snugly around your bare upper arm. There should be enough room to slip a fingertip under the cuff. The bottom edge of the cuff should be 1 inch above the crease of the elbow. Ideally, take 3 measurements at one sitting and record the average.

## 2024-01-16 NOTE — Progress Notes (Signed)
 Cardiology Office Note:  .   Date:  01/16/2024  ID:  Guy Sanchez, DOB November 23, 1960, MRN [id number] PCP: Clarice Nottingham, MD  [facility] Providers Cardiologist:  Soyla DELENA Merck, MD    History of Present Illness: Guy Sanchez   Guy Sanchez is a 63 y.o. male.  Discussed the use of AI scribe software for clinical note transcription with the patient, who gave verbal consent to proceed.  History of Present Illness Guy Sanchez is a 63 year old male with coronary artery disease who presents for a routine cardiovascular follow-up.  He engages in increased physical activity, including running and walking events, without experiencing chest pain or tightness. He participates in races and walks almost every morning, incorporating stair walking during lunch breaks.  He experiences occasional palpitations, with episodes of heart racing for six to eight seconds, occurring infrequently and not related to physical activity. He consumes a tumbler of coffee in the morning and drinks green tea.  He has adopted a mostly vegetarian diet over the past three to four months, aiming for three to four meat-free days per week.  He uses a CPAP machine for sleep apnea, which he has used for three to four years, but reports suboptimal sleep quality and waking with air in his stomach. He uses the CPAP nightly.  His current medications include a daily baby aspirin, Repatha  140 mg every 14 days, and Zetia  10 mg daily, taken every other Sunday and nightly before bed, respectively.    ROS: negative except per HPI above.  Studies Reviewed: Guy Sanchez   EKG Interpretation Date/Time:  Thursday January 16 2024 08:21:56 EST Ventricular Rate:  71 PR Interval:  170 QRS Duration:  76 QT Interval:  354 QTC Calculation: 384 R Axis:   90  Text Interpretation: Normal sinus rhythm Rightward axis When compared with ECG of 13-Nov-2022 08:16, No significant change was found Confirmed by Merck Soyla ([id number]) on 01/16/2024 8:40:08 AM     Results LABS Cholesterol LDL: 35 (04/2023)  RADIOLOGY Calcium score: 117 (2022)  DIAGNOSTIC ECG: Normal Risk Assessment/Calculations:       Physical Exam:   VS:  BP 137/74 (BP Location: Left Arm, Patient Position: Sitting, Cuff Size: Normal)   Pulse 64   Resp 16   Ht 6' 3 (1.905 m)   Wt 210 lb 3.2 oz (95.3 kg)   SpO2 97%   BMI 26.27 kg/m    Wt Readings from Last 3 Encounters:  01/16/24 210 lb 3.2 oz (95.3 kg)  01/10/23 208 lb 3.2 oz (94.4 kg)  11/13/22 208 lb 12.8 oz (94.7 kg)     Physical Exam GENERAL: Alert, cooperative, well developed, no acute distress. HEENT: Normocephalic, normal oropharynx, moist mucous membranes. CHEST: Clear to auscultation bilaterally, no wheezes, rhonchi, or crackles. CARDIOVASCULAR: Normal heart rate and rhythm, S1 and S2 normal without murmurs. ABDOMEN: Soft, non-tender, non-distended, without organomegaly, normal bowel sounds. EXTREMITIES: No cyanosis or edema. NEUROLOGICAL: Cranial nerves grossly intact, moves all extremities without gross motor or sensory deficit.   ASSESSMENT AND PLAN: .    Assessment and Plan Assessment & Plan Atherosclerotic heart disease of native coronary artery without angina pectoris No angina or EKG changes. Calcium score 117 in 2022. Medications effective. - Continue Repatha  140 mg every 14 days, Zetia  10 mg daily, baby aspirin 81 mg daily. - Refilled Repatha  and Zetia  for one year. - Monitor for new symptoms or changes.  Hypercholesterolemia LDL at 35 mg/dL. Medications effective in managing cholesterol and arterial plaques. - Continue Repatha   and Zetia .  Obstructive sleep apnea Managed with CPAP. Consistent use with occasional aerophagia. No significant symptom changes. - Continue CPAP therapy nightly. - Monitor for symptom changes or CPAP effectiveness.  Elevated blood pressure Slightly elevated BP. No home monitoring. - Purchase BP cuff, check BP three times weekly. - Report BP >130/80 mmHg  if persistent. - Consider medication adjustment if BP remains elevated.        Soyla Merck, MD, FACC

## 2024-03-31 ENCOUNTER — Encounter: Payer: Self-pay | Admitting: Internal Medicine

## 2024-03-31 NOTE — Telephone Encounter (Signed)
 Called patient to inform him about Repatha  co-pay card to help bring the price down. Instructed him where to go to apply for copay card and to show it to his pharmacy when he receives the card. Patient states that he believes he has completed this in the past. Patient will look into enrolling and will call or send a message if there are any issues.   Zunaira Afsar, PharmD Candidate [phone]
